# Patient Record
Sex: Male | Born: 1950 | Race: White | Hispanic: No | Marital: Married | Smoking: Never smoker
Health system: Southern US, Community
[De-identification: ages and names within clinical notes are randomized; demographics above are authoritative.]

## PROBLEM LIST (undated history)

## (undated) DIAGNOSIS — Z87442 Personal history of urinary calculi: Secondary | ICD-10-CM

## (undated) DIAGNOSIS — E7800 Pure hypercholesterolemia, unspecified: Secondary | ICD-10-CM

## (undated) DIAGNOSIS — I10 Essential (primary) hypertension: Secondary | ICD-10-CM

## (undated) DIAGNOSIS — J45909 Unspecified asthma, uncomplicated: Secondary | ICD-10-CM

## (undated) DIAGNOSIS — H539 Unspecified visual disturbance: Secondary | ICD-10-CM

## (undated) DIAGNOSIS — G43909 Migraine, unspecified, not intractable, without status migrainosus: Secondary | ICD-10-CM

## (undated) DIAGNOSIS — Z85828 Personal history of other malignant neoplasm of skin: Secondary | ICD-10-CM

## (undated) DIAGNOSIS — C801 Malignant (primary) neoplasm, unspecified: Secondary | ICD-10-CM

## (undated) DIAGNOSIS — N200 Calculus of kidney: Secondary | ICD-10-CM

## (undated) DIAGNOSIS — K219 Gastro-esophageal reflux disease without esophagitis: Secondary | ICD-10-CM

## (undated) HISTORY — PX: CYSTOSCOPY: SHX5120

## (undated) HISTORY — PX: ELBOW FRACTURE SURGERY: SHX616

## (undated) HISTORY — DX: Personal history of other malignant neoplasm of skin: Z85.828

## (undated) HISTORY — DX: Malignant (primary) neoplasm, unspecified: C80.1

## (undated) HISTORY — PX: CHOLECYSTECTOMY: SHX55

## (undated) HISTORY — DX: Migraine, unspecified, not intractable, without status migrainosus: G43.909

## (undated) HISTORY — DX: Calculus of kidney: N20.0

## (undated) HISTORY — PX: LITHOTRIPSY: SUR834

## (undated) HISTORY — DX: Unspecified visual disturbance: H53.9

---

## 2014-05-20 DIAGNOSIS — I10 Essential (primary) hypertension: Secondary | ICD-10-CM | POA: Insufficient documentation

## 2015-12-16 DIAGNOSIS — G43909 Migraine, unspecified, not intractable, without status migrainosus: Secondary | ICD-10-CM | POA: Insufficient documentation

## 2015-12-16 DIAGNOSIS — K219 Gastro-esophageal reflux disease without esophagitis: Secondary | ICD-10-CM | POA: Insufficient documentation

## 2015-12-16 DIAGNOSIS — E785 Hyperlipidemia, unspecified: Secondary | ICD-10-CM | POA: Insufficient documentation

## 2015-12-16 DIAGNOSIS — K589 Irritable bowel syndrome without diarrhea: Secondary | ICD-10-CM | POA: Insufficient documentation

## 2015-12-17 DIAGNOSIS — J309 Allergic rhinitis, unspecified: Secondary | ICD-10-CM | POA: Insufficient documentation

## 2015-12-17 DIAGNOSIS — N200 Calculus of kidney: Secondary | ICD-10-CM | POA: Insufficient documentation

## 2015-12-17 DIAGNOSIS — Z860100 Personal history of colon polyps, unspecified: Secondary | ICD-10-CM | POA: Insufficient documentation

## 2015-12-17 DIAGNOSIS — J4520 Mild intermittent asthma, uncomplicated: Secondary | ICD-10-CM | POA: Insufficient documentation

## 2015-12-20 DIAGNOSIS — J984 Other disorders of lung: Secondary | ICD-10-CM | POA: Insufficient documentation

## 2016-01-12 DIAGNOSIS — K8020 Calculus of gallbladder without cholecystitis without obstruction: Secondary | ICD-10-CM | POA: Insufficient documentation

## 2016-06-15 ENCOUNTER — Ambulatory Visit (INDEPENDENT_AMBULATORY_CARE_PROVIDER_SITE_OTHER): Payer: Commercial Managed Care - PPO | Admitting: Neurology

## 2016-06-15 ENCOUNTER — Encounter: Payer: Self-pay | Admitting: Neurology

## 2016-06-15 DIAGNOSIS — IMO0002 Reserved for concepts with insufficient information to code with codable children: Secondary | ICD-10-CM | POA: Insufficient documentation

## 2016-06-15 DIAGNOSIS — G43709 Chronic migraine without aura, not intractable, without status migrainosus: Secondary | ICD-10-CM

## 2016-06-15 DIAGNOSIS — N400 Enlarged prostate without lower urinary tract symptoms: Secondary | ICD-10-CM | POA: Diagnosis not present

## 2016-06-15 DIAGNOSIS — Z87442 Personal history of urinary calculi: Secondary | ICD-10-CM

## 2016-06-15 MED ORDER — NORTRIPTYLINE HCL 25 MG PO CAPS: 25.0000 mg | ORAL_CAPSULE | Freq: Every day | ORAL | 3 refills | Status: DC

## 2016-06-15 NOTE — Progress Notes (Signed)
[facility]  PATIENT: Nathaniel Norris Md Strauch DOB: 11/12/1951  REFERRING DOCTOR OR PCP:  None SOURCE: Patient, notes from [facility]  _________________________________   HISTORICAL  CHIEF COMPLAINT:  Chief Complaint  Patient presents with  . Migraines    Former pt. of Nathaniel Madden from [facility], Here for f/u of migraines.  Sts. he decreased Keppra to 500mg  daily due to low platelets.  H/A's have been more frequent, more severe--almost daily he wakes b/t 4am-6am with h/a.  He has been eval for allergies, sts. was told h/a's are not related to allergies/fim    HISTORY OF PRESENT ILLNESS:  Nathaniel Madden is a 65 yo man with migraine headaches who I have seen in the past at [facility], greater than 3 years ago.    Nathaniel Madden began to experience headaches as a teenager but they were more intermittent when he was younger. The headaches became more chronic about 15 years ago. At time, they have mostly occurred on a near daily basis. In the past, he has been on multiple medications as migraine prophylaxis. Zonisamide was tried but he developed kidney stones and had to be discontinued. Due to the kidney stone risk, Topamax cannot be used. Keppra was initiated and helped his headaches initially ever, due to low platelets the Keppra needed to be cut back. At the current dose of 500 mg daily, it is not helping the headaches and he also feels a little bit of a mental fog. About 4 years ago, he was on Botox and got up to 6 months benefit to a frequency  less than one migraine a week for a while.     Currently, he is experiencing headaches 6 out of every 7 days (25/30 days per month) the headaches occur for more than 4 hours each day.  In general, the headaches are left-sided with a pounding character. The headaches are usually associated with nausea, especially at the onset. Additionally he has photophobia and phonophobia. Moving the head will make the  headaches feel worse. He takes Maxalt when necessary. The Maxalt will often help to eliminate the headache.  Chronic migraine medications tried and failed: Antiepileptics: Zonisamide/Topamax was associated with kidney stones. Keppra has not been of benefit and causes him to feel foggy. Tricyclics: He has benign prostatic hypertrophy. Beta blockers were noneffective in the past Muscle relaxants were noneffective in the past NSAIDs (multiple) have not been effective enough. Additionally, he has GERD    REVIEW OF SYSTEMS: Constitutional: No fevers, chills, sweats, or change in appetite Eyes: No visual changes, double vision, eye pain Ear, nose and throat: No hearing loss, ear pain, nasal congestion, sore throat Cardiovascular: No chest pain, palpitations Respiratory: No shortness of breath at rest or with exertion.   No wheezes GastrointestinaI: No nausea, vomiting, diarrhea, abdominal pain, fecal incontinence Genitourinary: He has BPH. He has a history kidney stones.. Musculoskeletal: No neck pain, back pain Integumentary: No rash, pruritus, skin lesions Neurological: as above Psychiatric: No depression at this time.  No anxiety Endocrine: No palpitations, diaphoresis, change in appetite, change in weigh or increased thirst Hematologic/Lymphatic: No anemia, purpura, petechiae. Allergic/Immunologic: No itchy/runny eyes, nasal congestion, recent allergic reactions, rashes  ALLERGIES: Allergies  Allergen Reactions  . Levofloxacin     angioedema    HOME MEDICATIONS:  Current Outpatient Prescriptions:  .  albuterol (PROVENTIL HFA;VENTOLIN HFA) 108 (90 Base) MCG/ACT inhaler, Inhale into the lungs., Disp: , Rfl:  .  esomeprazole (NEXIUM) 40 MG capsule, Take  40 mg by mouth., Disp: , Rfl:  .  etodolac (LODINE) 400 MG tablet, Take 400 mg by mouth., Disp: , Rfl:  .  levETIRAcetam (KEPPRA) 500 MG tablet, Take 500 mg by mouth., Disp: , Rfl:  .  loratadine-pseudoephedrine (CLARITIN-D  24-HOUR) 10-240 MG 24 hr tablet, Take by mouth., Disp: , Rfl:  .  mometasone-formoterol (DULERA) 200-5 MCG/ACT AERO, Inhale into the lungs., Disp: , Rfl:  .  nebivolol (BYSTOLIC) 5 MG tablet, Take 5 mg by mouth., Disp: , Rfl:  .  nortriptyline (PAMELOR) 25 MG capsule, Take 1 capsule (25 mg total) by mouth at bedtime., Disp: 90 capsule, Rfl: 3  PAST MEDICAL HISTORY: Past Medical History:  Diagnosis Date  . Cancer ([place])   . Hx of skin cancer, basal cell   . Kidney stones   . Migraines   . Vision abnormalities     PAST SURGICAL HISTORY: Past Surgical History:  Procedure Laterality Date  . CHOLECYSTECTOMY    . ELBOW FRACTURE SURGERY Bilateral     FAMILY HISTORY: Family History  Problem Relation Age of Onset  . Diabetes Mother   . Hypertension Mother   . Pneumonia Father   . Hypertension Father   . Diabetes Father   . Dementia Father     SOCIAL HISTORY:  Social History   Social History  . Marital status: Married    Spouse name: N/A  . Number of children: N/A  . Years of education: N/A   Occupational History  . Not on file.   Social History Main Topics  . Smoking status: Never Smoker  . Smokeless tobacco: Not on file  . Alcohol use Not on file  . Drug use: Unknown  . Sexual activity: Not on file   Other Topics Concern  . Not on file   Social History Narrative  . No narrative on file     PHYSICAL EXAM  Vitals:   06/15/16 1320  BP: 130/80    There is no height or weight on file to calculate BMI.   General: The patient is well-developed and well-nourished and in no acute distress  Eyes:  Funduscopic exam shows normal optic discs and retinal vessels.  Neck: The neck is supple, no carotid bruits are noted.  The neck is nontender.  Cardiovascular: The heart has a regular rate and rhythm with a normal S1 and S2. There were no murmurs, gallops or rubs.   Skin: Extremities are without rash or edema.  Musculoskeletal:  Back is nontender  Neurologic  Exam  Mental status: The patient is alert and oriented x 3 at the time of the examination. The patient has apparent normal recent and remote memory, with an apparently normal attention span and concentration ability.   Speech is normal.  Cranial nerves: Extraocular movements are full. Pupils are equal, round, and reactive to light and accomodation.  Visual fields are full.  Facial symmetry is present. There is good facial sensation to soft touch bilaterally.Facial strength is normal.  Trapezius and sternocleidomastoid strength is normal. No dysarthria is noted.  The tongue is midline, and the patient has symmetric elevation of the soft palate. No obvious hearing deficits are noted.  Motor:  Muscle bulk is normal.   Tone is normal. Strength is  5 / 5 in all 4 extremities.   Sensory: Sensory testing is intact to temperature, soft touch and vibration sensation in all 4 extremities.  Coordination: Cerebellar testing reveals good finger-nose-finger bilaterally.  Gait and station: Station is normal.  Gait is normal. Tandem gait is normal. Romberg is negative.   Reflexes: Deep tendon reflexes are symmetric and normal bilaterally.   Plantar responses are flexor.    DIAGNOSTIC DATA (LABS, IMAGING, TESTING) - I reviewed patient records, labs, notes, testing and imaging myself where available.      ASSESSMENT AND PLAN  Chronic migraine  History of kidney stones  BPH (benign prostatic hyperplasia)    1.   In the past, Botox injections helped his chronic migraines the past. He has tried and failed multiple other medications as detailed above. 2.    He will stop Keppra. I'll have him try low-dose nortriptyline. Due to his benign prostatic hypertrophy, he will be unable to go on higher doses. Due to his history of kidney stones, he cannot go on topiramate or zonisamide.   3.    He will return to see me as needed and will return for the Botox injections.   Manraj Yeo A. Felecia Shelling, MD, PhD  [id number], [id number] PM Certified in Neurology, Clinical Neurophysiology, Sleep Medicine, Pain Medicine and Neuroimaging  [facility] [address] [place], [place] [address] [phone]

## 2016-08-02 ENCOUNTER — Telehealth: Payer: Self-pay | Admitting: Neurology

## 2016-08-02 MED ORDER — RIZATRIPTAN BENZOATE 10 MG PO TABS
10.0000 mg | ORAL_TABLET | ORAL | 0 refills | Status: DC | PRN
Start: 2016-08-02 — End: 2016-08-30

## 2016-08-02 NOTE — Telephone Encounter (Signed)
I will check with Nathaniel Madden to see where we are with the Botox approval./fim

## 2016-08-02 NOTE — Telephone Encounter (Signed)
Pt called and needs refill on Rizatriptan- He was prescribed medication while seeing Dr. Felecia Shelling at [facility]. Pt also wants to know about Botox, he has not heard anything . Please call [phone]

## 2016-08-02 NOTE — Telephone Encounter (Signed)
Noted/fim 

## 2016-08-02 NOTE — Telephone Encounter (Signed)
Called and scheduled patient for injections.

## 2016-08-30 ENCOUNTER — Encounter: Payer: Self-pay | Admitting: Neurology

## 2016-08-30 ENCOUNTER — Ambulatory Visit (INDEPENDENT_AMBULATORY_CARE_PROVIDER_SITE_OTHER): Payer: Commercial Managed Care - PPO | Admitting: Neurology

## 2016-08-30 VITALS — BP 146/84 | HR 70 | Resp 16 | Wt 197.0 lb

## 2016-08-30 DIAGNOSIS — IMO0002 Reserved for concepts with insufficient information to code with codable children: Secondary | ICD-10-CM

## 2016-08-30 DIAGNOSIS — G43709 Chronic migraine without aura, not intractable, without status migrainosus: Secondary | ICD-10-CM | POA: Diagnosis not present

## 2016-08-30 MED ORDER — NORTRIPTYLINE HCL 25 MG PO CAPS: 25.0000 mg | ORAL_CAPSULE | Freq: Every day | ORAL | 3 refills | Status: DC

## 2016-08-30 MED ORDER — RIZATRIPTAN BENZOATE 10 MG PO TABS: 10.0000 mg | ORAL_TABLET | ORAL | 3 refills | Status: DC | PRN

## 2016-08-30 NOTE — Progress Notes (Signed)
[facility]  PATIENT: Nathaniel Madden DOB: 31-May-1951  REFERRING DOCTOR OR PCP:  None SOURCE: Patient, notes from [facility]  _________________________________   HISTORICAL  CHIEF COMPLAINT:  Chief Complaint  Patient presents with  . 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  Since the last visit, nortriptyline was added. He tolerates it well and it is helping a little bit. However, he continues to have near daily headaches.  Currently, he is experiencing headaches 5-6 out of every 7 days (21-25/30 days per month).  The headaches occur for more than 4 hours each day.  In general, the headaches are left-sided with a pounding character. The headaches are usually associated with nausea, especially at the onset. Additionally he has photophobia and phonophobia. Moving the head will make the headaches feel worse. He takes Maxalt when necessary. The Maxalt will often help to eliminate the headache.  Chronic migraine medications tried and failed: Antiepileptics: Zonisamide/Topamax was associated with kidney stones. Keppra has not been of benefit and causes him to feel foggy. Tricyclics: He has benign prostatic hypertrophy. Beta blockers were noneffective in the past Muscle relaxants were noneffective in the past NSAIDs (multiple) have not been effective enough. 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 The headaches became more chronic about 15 years ago. At time, they have mostly occurred on a near daily basis. In the past, he has been on multiple medications as migraine prophylaxis. Zonisamide was tried but he developed kidney stones and had to be discontinued. Due to the kidney stone risk, Topamax cannot be used. Keppra was initiated and helped his headaches initially  ever, due to low platelets the Keppra needed to be cut back. At the current dose of 500 mg daily, it is not helping the headaches and he also feels a little bit of a mental fog. About 4 years ago, he was on Botox and got up to 6 months benefit to a frequency  less than one migraine a week for a while.      REVIEW OF SYSTEMS: Constitutional: No fevers, chills, sweats, or change in appetite Eyes: No visual changes, double vision, eye pain Ear, nose and throat: No hearing loss, ear pain, nasal congestion, sore throat Cardiovascular: No chest pain, palpitations Respiratory: No shortness of breath at rest or with exertion.   No wheezes GastrointestinaI: No nausea, vomiting, diarrhea, abdominal pain, fecal incontinence Genitourinary: He has BPH. He has a history kidney stones.. Musculoskeletal: No neck pain, back pain Integumentary: No rash, pruritus, skin lesions Neurological: as above Psychiatric: No depression at this time.  No anxiety Endocrine: No palpitations, diaphoresis, change in appetite, change in weigh or increased thirst Hematologic/Lymphatic: No anemia, purpura, petechiae. Allergic/Immunologic: No itchy/runny eyes, nasal congestion, recent allergic reactions, rashes  ALLERGIES: Allergies  Allergen Reactions  . Levofloxacin     angioedema    HOME MEDICATIONS:  Current Outpatient Prescriptions:  .  albuterol (PROVENTIL HFA;VENTOLIN HFA) 108 (90 Base) MCG/ACT inhaler, Inhale into the lungs., Disp: , Rfl:  .  esomeprazole (NEXIUM) 40 MG capsule, Take 40 mg by mouth., Disp: , Rfl:  .  etodolac (LODINE) 400 MG tablet, Take 400 mg by mouth., Disp: , Rfl:  .  levETIRAcetam (KEPPRA) 500 MG tablet, Take 500 mg by mouth., Disp: , Rfl:  .  mometasone-formoterol (DULERA) 200-5 MCG/ACT AERO, Inhale into the lungs., Disp: , Rfl:  .  nebivolol (BYSTOLIC) 5 MG tablet, Take 5 mg by mouth., Disp: , Rfl:  .  nortriptyline (PAMELOR) 25 MG capsule, Take 1 capsule (25 mg total) by mouth at  bedtime., Disp: 90 capsule, Rfl: 3 .  rizatriptan (MAXALT) 10 MG tablet, Take 1 tablet (10 mg total) by mouth as needed for migraine. May repeat in 2 hours if needed, Disp: 30 tablet, Rfl: 3  PAST MEDICAL HISTORY: Past Medical History:  Diagnosis Date  . Cancer ([place])   . Hx of skin cancer, basal cell   . Kidney stones   . Migraines   . Vision abnormalities     PAST SURGICAL HISTORY: Past Surgical History:  Procedure Laterality Date  . CHOLECYSTECTOMY    . ELBOW FRACTURE SURGERY Bilateral     FAMILY HISTORY: Family History  Problem Relation Age of Onset  . Diabetes Mother   . Hypertension Mother   . Pneumonia Father   . Hypertension Father   . Diabetes Father   . Dementia Father     SOCIAL HISTORY:  Social History   Social History  . Marital status: Married    Spouse name: N/A  . Number of children: N/A  . Years of education: N/A   Occupational History  . Not on file.   Social History Main Topics  . Smoking status: Never Smoker  . Smokeless tobacco: Not on file  . Alcohol use Not on file  . Drug use: Unknown  . Sexual activity: Not on file   Other Topics Concern  . Not on file   Social History Narrative  . No narrative on file     PHYSICAL EXAM  Vitals:   08/30/16 1618  BP: (!) 146/84  Pulse: 70  Resp: 16  Weight: 197 lb (89.4 kg)    There is no height or weight on file to calculate BMI.   General: The patient is well-developed and well-nourished and in no acute distress   Neck: The neck has good ROM.  The neck is nontender.   Neurologic Exam  Mental status: The patient is alert and oriented x 3 at the time of the examination. The patient has apparent normal recent and remote memory, with an apparently normal attention span and concentration ability.   Speech is normal.  Cranial nerves: Extraocular movements are full.  Facial strength is normal.  Trapezius and sternocleidomastoid strength is normal. No dysarthria is noted.    No obvious  hearing deficits are noted.  Motor:  Muscle bulk is normal.   Tone is normal. Strength is  5 / 5 in all 4 extremities.   Gait and station: Station is normal.   Gait is normal. Tandem gait is normal.     PROCEDURE Botox injections for chronic migraine as follows: 115 U Botox was injected into the muscles of the bilateral face and scalp (frontalis x 6 x 5U, temporalis x 8 x 5 U, occipitalis x 6 x 5 U, nasalis x 5U, corrugators x 2 x 5U) 70 U Botox was injected into the muscles of the neck and shoulders (splenius capitis 2 x 15 U, trapezius, 2 x 10 U, C6C7 paraspinal muscles 2 x 10 U.    15 U wasted  He tolerated the injections well.    ASSESSMENT AND PLAN  Chronic migraine   1.   Botox injections as detailed above for chronic migraines.  He tolerated the injections well.  2.    He will continue nortriptyline..   3.    He will return to see me in 3 months for the next Botox or as needed and will return for the Botox injections.   Markia Kyer A. Felecia Shelling, MD, PhD [id number], [id number] PM Certified in Neurology, Clinical Neurophysiology, Sleep Medicine, Pain Medicine and Neuroimaging  [facility] [address], [place], [place] [address] [phone]

## 2016-08-31 ENCOUNTER — Encounter: Payer: Self-pay | Admitting: *Deleted

## 2016-12-06 ENCOUNTER — Ambulatory Visit: Payer: Commercial Managed Care - PPO | Admitting: Neurology

## 2017-01-17 ENCOUNTER — Ambulatory Visit (INDEPENDENT_AMBULATORY_CARE_PROVIDER_SITE_OTHER): Payer: Commercial Managed Care - PPO | Admitting: Neurology

## 2017-01-17 ENCOUNTER — Encounter: Payer: Self-pay | Admitting: Neurology

## 2017-01-17 VITALS — BP 137/79 | HR 79 | Resp 16 | Wt 200.5 lb

## 2017-01-17 DIAGNOSIS — G43709 Chronic migraine without aura, not intractable, without status migrainosus: Secondary | ICD-10-CM

## 2017-01-17 DIAGNOSIS — R413 Other amnesia: Secondary | ICD-10-CM

## 2017-01-17 DIAGNOSIS — IMO0002 Reserved for concepts with insufficient information to code with codable children: Secondary | ICD-10-CM

## 2017-01-17 NOTE — Progress Notes (Signed)
[facility]  PATIENT: Nathaniel Madden DOB: 1950/12/27  REFERRING DOCTOR OR PCP:  None SOURCE: Patient, notes from [facility]  _________________________________   HISTORICAL  CHIEF COMPLAINT:  Chief Complaint  Patient presents with  . Memory Loss    Sts. he is having more difficutly remembering things, multitasking at work.  Believes this may be due to decreased focus/concentration, but since his father had Alzheimer's Dz. 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 Focus and attention are reduced.  He notes difficulty keeping track of multiple problems a patient reports and is mkeeping   His father had Alzheimer's disease.   Today, we did the MoCA test and he scored 27/30 (-2 for recall and -1 for language).   He is working full time in [employer] and plans to work until age 68.   He notes no change in gait or bladder.  He denies depression but maybe a little irritable at times.  He denies anxiety.   He sleeps well and he does not snore loudly and has never been noted to have OSA symptoms.    He sleeps better on nortriptyline.   Lab work was reviewed. The TSH and calcium were normal when last tested.    Headache:   Since the last visit, nortriptyline was added and he is doing better.  He tolerates it well and it is helping a little bit. However, he continues to have near daily headaches.   With the last Botox injections, he thought he got a benefit but he noticed some weakness with neck extension and had mild eyelid drooping..     In general, the headaches are left-sided with a pounding character. The headaches are usually associated with nausea, especially at the onset. Additionally he has photophobia and phonophobia. Moving the head will make the headaches feel worse. He takes Maxalt when  necessary. Maxalt will often help to eliminate the headache.  Chronic migraine medications tried and failed: Antiepileptics: Zonisamide/Topamax was associated with kidney stones. Keppra has not been of benefit and causes him to feel foggy. Tricyclics: He has benign prostatic hypertrophy. Beta blockers were noneffective in the past Muscle relaxants were noneffective in the past NSAIDs (multiple) have not been effective enough. 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 The headaches became more chronic about 15 years ago. At time, they have mostly occurred on a near daily basis. In the past, he has been on multiple medications as migraine prophylaxis. Zonisamide was tried but he developed kidney stones and had to be discontinued. Due to the kidney stone risk, Topamax cannot be used. Keppra was initiated and helped his headaches initially ever, due to low platelets the Keppra needed to be cut back. At the current dose of 500 mg daily, it is not helping the headaches and he also feels a little bit of a mental fog. About 4 years ago, he was on Botox and got up to 6 months benefit to a frequency  less than one migraine a week for a while.      REVIEW OF SYSTEMS: Constitutional: No fevers, chills, sweats, or change in appetite.   He sleeps well Eyes: No visual changes, double vision, eye pain Ear, nose and throat:  No hearing loss, ear pain, nasal congestion, sore throat Cardiovascular: No chest pain, palpitations Respiratory: No shortness of breath at rest or with exertion.   No wheezes GastrointestinaI: No nausea, vomiting, diarrhea, abdominal pain, fecal incontinence Genitourinary: He has BPH. He has a history kidney stones.. Musculoskeletal: No neck pain, back pain Integumentary: No rash, pruritus, skin lesions Neurological: as above Psychiatric: No depression at this time.  No anxiety Endocrine: No  palpitations, diaphoresis, change in appetite, change in weigh or increased thirst Hematologic/Lymphatic: No anemia, purpura, petechiae. Allergic/Immunologic: No itchy/runny eyes, nasal congestion, recent allergic reactions, rashes  ALLERGIES: Allergies  Allergen Reactions  . Levofloxacin     angioedema    HOME MEDICATIONS:  Current Outpatient Prescriptions:  .  albuterol (PROVENTIL HFA;VENTOLIN HFA) 108 (90 Base) MCG/ACT inhaler, Inhale into the lungs., Disp: , Rfl:  .  esomeprazole (NEXIUM) 40 MG capsule, Take 40 mg by mouth., Disp: , Rfl:  .  loratadine-pseudoephedrine (CLARITIN-D 24-HOUR) 10-240 MG 24 hr tablet, Take 1 tablet by mouth daily., Disp: , Rfl:  .  mometasone-formoterol (DULERA) 200-5 MCG/ACT AERO, Inhale into the lungs., Disp: , Rfl:  .  nebivolol (BYSTOLIC) 5 MG tablet, Take 5 mg by mouth., Disp: , Rfl:  .  nortriptyline (PAMELOR) 25 MG capsule, Take 1 capsule (25 mg total) by mouth at bedtime., Disp: 90 capsule, Rfl: 3 .  rizatriptan (MAXALT) 10 MG tablet, Take 1 tablet (10 mg total) by mouth as needed for migraine. May repeat in 2 hours if needed, Disp: 30 tablet, Rfl: 3 .  tamsulosin (FLOMAX) 0.4 MG CAPS capsule, Take 0.4 mg by mouth at bedtime., Disp: , Rfl:   PAST MEDICAL HISTORY: Past Medical History:  Diagnosis Date  . Cancer ([place])   . Hx of skin cancer, basal cell   . Kidney stones   . Migraines   . Vision abnormalities     PAST SURGICAL HISTORY: Past Surgical History:  Procedure Laterality Date  . CHOLECYSTECTOMY    . ELBOW FRACTURE SURGERY Bilateral     FAMILY HISTORY: Family History  Problem Relation Age of Onset  . Diabetes Mother   . Hypertension Mother   . Pneumonia Father   . Hypertension Father   . Diabetes Father   . Dementia Father     SOCIAL HISTORY:  Social History   Social History  . Marital status: Married    Spouse name: N/A  . Number of children: N/A  . Years of education: N/A   Occupational History  . Not on  file.   Social History Main Topics  . Smoking status: Never Smoker  . Smokeless tobacco: Never Used  . Alcohol use Not on file  . Drug use: Unknown  . Sexual activity: Not on file   Other Topics Concern  . Not on file   Social History Narrative  . No narrative on file     PHYSICAL EXAM  Vitals:   01/17/17 1330  BP: 137/79  Pulse: 79  Resp: 16  Weight: 200 lb 8 oz (90.9 kg)    There is no height or weight on file to calculate BMI.   General: The patient is well-developed and well-nourished and in no acute distress   Neck: The neck has good ROM.  The neck is nontender.   Neurologic Exam  Mental status: The patient is alert and oriented x 3 at the time of the examination. The patient has apparent normal recent and remote memory, with an apparently normal attention span and concentration ability.  Speech is normal.  Cranial nerves: Extraocular movements are full.  Facial strength is normal.  Trapezius and sternocleidomastoid strength is normal. No dysarthria is noted.    No obvious hearing deficits are noted.  Motor:  Muscle bulk is normal.   Tone is normal. Strength is  5 / 5 in all 4 extremities.   Sensory:   Vibration sensation is reduced in the toes to 25% but normal at ankles.     Gait and station: Station is normal.   Gait is normal. Tandem gait is normal.     PROCEDURE    ASSESSMENT AND PLAN  Chronic migraine - Plan: MR BRAIN WO CONTRAST  Memory change - Plan: Vitamin B12, VITAMIN D 25 Hydroxy (Vit-D Deficiency, Fractures), MR BRAIN WO CONTRAST   1.     The primary issues are more likely to be due to decreased focus and attention and to a primary memory disorder.  We will check an MRI of the brain without contrast to determine if there is an ischemic or inflammatory etiology rule and to rule out subdural hematomas and hydrocephalus 2.    He will continue nortriptyline for chronic migraine and consider Botox if headaches worsen again..   3.    He will  return to see me in 6 months or as needed   Cesareo Vickrey A. Felecia Shelling, MD, PhD [id number], 123456 PM Certified in Neurology, Clinical Neurophysiology, Sleep Medicine, Pain Medicine and Neuroimaging  [facility] [address] [place], [place] [address] [phone]

## 2017-01-18 LAB — VITAMIN D 25 HYDROXY (VIT D DEFICIENCY, FRACTURES): Vit D, 25-Hydroxy: 17.2 ng/mL — ABNORMAL LOW (ref 30.0–100.0)

## 2017-01-18 LAB — VITAMIN B12: VITAMIN B 12: 630 pg/mL (ref 232–1245)

## 2017-01-19 ENCOUNTER — Telehealth: Payer: Self-pay | Admitting: *Deleted

## 2017-01-19 MED ORDER — VITAMIN D (ERGOCALCIFEROL) 1.25 MG (50000 UNIT) PO CAPS: 50000.0000 [IU] | ORAL_CAPSULE | ORAL | 0 refills | Status: DC

## 2017-01-19 NOTE — Telephone Encounter (Signed)
I have spoken with [name] this am and per RAS, reviewed lab results as below, and rec. tx. plan.  He is agreable.  Rx. escribed to [facility]/fim

## 2017-01-19 NOTE — Telephone Encounter (Signed)
-----   Message from Britt Bottom, MD sent at 01/18/2017  6:51 PM EST ----- Dr. Jerrye Bushy vitamin D was moderately low at 17.2. Please call in 50,000 units weekly 12 weeks and then he should take 2000 units daily afterwards.   B12 was fine.

## 2017-01-20 ENCOUNTER — Ambulatory Visit
Admission: RE | Admit: 2017-01-20 | Discharge: 2017-01-20 | Disposition: A | Discharge status: Home / Self Care | Payer: Commercial Managed Care - PPO | Source: Ambulatory Visit | Attending: Neurology | Admitting: Neurology

## 2017-01-20 DIAGNOSIS — G43709 Chronic migraine without aura, not intractable, without status migrainosus: Secondary | ICD-10-CM

## 2017-01-20 DIAGNOSIS — R413 Other amnesia: Secondary | ICD-10-CM

## 2017-01-20 DIAGNOSIS — IMO0002 Reserved for concepts with insufficient information to code with codable children: Secondary | ICD-10-CM

## 2017-01-22 ENCOUNTER — Telehealth: Payer: Self-pay | Admitting: Neurology

## 2017-01-22 NOTE — Telephone Encounter (Signed)
I discussed the results of the MRI with [name].

## 2017-07-25 ENCOUNTER — Ambulatory Visit: Payer: Commercial Managed Care - PPO | Admitting: Neurology

## 2017-09-12 ENCOUNTER — Ambulatory Visit (INDEPENDENT_AMBULATORY_CARE_PROVIDER_SITE_OTHER): Payer: PRIVATE HEALTH INSURANCE | Admitting: Neurology

## 2017-09-12 ENCOUNTER — Encounter: Payer: Self-pay | Admitting: Neurology

## 2017-09-12 VITALS — BP 134/80 | HR 78 | Resp 16 | Wt 202.0 lb

## 2017-09-12 DIAGNOSIS — G43009 Migraine without aura, not intractable, without status migrainosus: Secondary | ICD-10-CM | POA: Diagnosis not present

## 2017-09-12 DIAGNOSIS — R413 Other amnesia: Secondary | ICD-10-CM | POA: Diagnosis not present

## 2017-09-12 MED ORDER — RIZATRIPTAN BENZOATE 10 MG PO TABS: 10.0000 mg | ORAL_TABLET | ORAL | 3 refills | Status: DC | PRN

## 2017-09-12 MED ORDER — NORTRIPTYLINE HCL 25 MG PO CAPS: ORAL_CAPSULE | ORAL | 3 refills | Status: DC

## 2017-09-12 NOTE — Progress Notes (Signed)
[facility]  PATIENT: Nathaniel Madden DOB: 26-Dec-1950  REFERRING DOCTOR OR PCP:  None SOURCE: Patient, notes from [facility]  _________________________________   HISTORICAL  CHIEF COMPLAINT:  Chief Complaint  Patient presents with  . 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    Update 09/12/2017:     He is noting HA 10-15 days a month, usually over the left eye more than the right side.    He can't identify any definite triggers though some occur with changes in barometric pressure.    Sometimes he wakes up with one.    He takes Maxalt for most of them and Excedrin Migraine if milder.     He feels that the nortriptyline is helping the headache frequency some. It has helped better than many other medications which have been tried. He cannot go on Topamax or zonisamide due to kidney stones.  He sleeps well at night.     He feels that his memory is doing better now than earlier this year. He still occasionally notes some reduced recall but this is not getting in the way of his job.   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 Focus and attention are reduced.  He notes difficulty keeping track of multiple problems a patient reports and is mkeeping   His father had Alzheimer's disease.   Today, we did the MoCA test and he scored 27/30 (-2 for recall and -1 for language).   He is working full time in [employer] and plans to work until age 12.   He notes no change in gait or bladder.  He denies depression but maybe a little irritable at times.  He denies anxiety.   He sleeps well and he does not snore loudly and has never been noted to have  OSA symptoms.    He sleeps better on nortriptyline.   Lab work was reviewed. The TSH and calcium were normal when last tested.    Headache:   Since the last visit, nortriptyline was added and he is doing better.  He tolerates it well and it is helping a little bit. However, he continues to have near daily headaches.   With the last Botox injections, he thought he got a benefit but he noticed some weakness with neck extension and had mild eyelid drooping..     In general, the headaches are left-sided with a pounding character. The headaches are usually associated with nausea, especially at the onset. Additionally he has photophobia and phonophobia. Moving the head will make the headaches feel worse. He takes Maxalt when necessary. Maxalt will often help to eliminate the headache.  Chronic migraine medications tried and failed: Antiepileptics: Zonisamide/Topamax was associated with kidney stones. Keppra has not been of benefit and causes him to feel foggy. Tricyclics: He has benign prostatic hypertrophy. Beta blockers were noneffective in the past Muscle relaxants were noneffective in the past NSAIDs (multiple) have not been effective enough. 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 The headaches became more chronic about 15 years  ago. At time, they have mostly occurred on a near daily basis. In the past, he has been on multiple medications as migraine prophylaxis. Zonisamide was tried but he developed kidney stones and had to be discontinued. Due to the kidney stone risk, Topamax cannot be used. Keppra was initiated and helped his headaches initially ever, due to low platelets the Keppra needed to be cut back. At the current dose of 500 mg daily, it is not helping the headaches and he also feels a little bit of a mental fog. About 4 years ago, he was on Botox and got up to 6 months benefit to a frequency  less than  one migraine a week for a while.      REVIEW OF SYSTEMS: Constitutional: No fevers, chills, sweats, or change in appetite.   He sleeps well Eyes: No visual changes, double vision, eye pain Ear, nose and throat: No hearing loss, ear pain, nasal congestion, sore throat Cardiovascular: No chest pain, palpitations Respiratory: No shortness of breath at rest or with exertion.   No wheezes GastrointestinaI: No nausea, vomiting, diarrhea, abdominal pain, fecal incontinence Genitourinary: He has BPH. He has a history kidney stones.. Musculoskeletal: No neck pain, back pain Integumentary: No rash, pruritus, skin lesions Neurological: as above Psychiatric: No depression at this time.  No anxiety Endocrine: No palpitations, diaphoresis, change in appetite, change in weigh or increased thirst Hematologic/Lymphatic: No anemia, purpura, petechiae. Allergic/Immunologic: No itchy/runny eyes, nasal congestion, recent allergic reactions, rashes  ALLERGIES: Allergies  Allergen Reactions  . Levofloxacin     angioedema    HOME MEDICATIONS:  Current Outpatient Prescriptions:  .  albuterol (PROVENTIL HFA;VENTOLIN HFA) 108 (90 Base) MCG/ACT inhaler, Inhale into the lungs., Disp: , Rfl:  .  loratadine-pseudoephedrine (CLARITIN-D 24-HOUR) 10-240 MG 24 hr tablet, Take 1 tablet by mouth daily., Disp: , Rfl:  .  mometasone-formoterol (DULERA) 200-5 MCG/ACT AERO, Inhale into the lungs., Disp: , Rfl:  .  nebivolol (BYSTOLIC) 5 MG tablet, Take 5 mg by mouth., Disp: , Rfl:  .  nortriptyline (PAMELOR) 25 MG capsule, Take one or two qHS, Disp: 180 capsule, Rfl: 3 .  rizatriptan (MAXALT) 10 MG tablet, Take 1 tablet (10 mg total) by mouth as needed for migraine. May repeat in 2 hours if needed, Disp: 30 tablet, Rfl: 3 .  tamsulosin (FLOMAX) 0.4 MG CAPS capsule, Take 0.4 mg by mouth at bedtime., Disp: , Rfl:  .  esomeprazole (NEXIUM) 40 MG capsule, Take 40 mg by mouth., Disp: , Rfl:   PAST MEDICAL HISTORY: Past  Medical History:  Diagnosis Date  . Cancer ([place])   . Hx of skin cancer, basal cell   . Kidney stones   . Migraines   . Vision abnormalities     PAST SURGICAL HISTORY: Past Surgical History:  Procedure Laterality Date  . CHOLECYSTECTOMY    . ELBOW FRACTURE SURGERY Bilateral     FAMILY HISTORY: Family History  Problem Relation Age of Onset  . Diabetes Mother   . Hypertension Mother   . Pneumonia Father   . Hypertension Father   . Diabetes Father   . Dementia Father     SOCIAL HISTORY:  Social History   Social History  . Marital status: Married    Spouse name: N/A  . Number of children: N/A  . Years of education: N/A   Occupational History  . Not on file.   Social History Main Topics  . Smoking status: Never Smoker  . Smokeless tobacco: Never Used  .  Alcohol use Not on file  . Drug use: Unknown  . Sexual activity: Not on file   Other Topics Concern  . Not on file   Social History Narrative  . No narrative on file     PHYSICAL EXAM  Vitals:   09/12/17 1511  BP: 134/80  Pulse: 78  Resp: 16  Weight: 202 lb (91.6 kg)    There is no height or weight on file to calculate BMI.   General: The patient is well-developed and well-nourished and in no acute distress   Neck: The neck has good ROM.  The neck is nontender.   Neurologic Exam  Mental status: The patient is alert and oriented x 3 at the time of the examination. The patient has apparent normal recent and remote memory, with an apparently normal attention span and concentration ability.   Speech is normal.  Cranial nerves: Extraocular movements are full.  Facial strength is normal.  Trapezius and sternocleidomastoid strength is normal. No dysarthria is noted.    No obvious hearing deficits are noted.  Motor:  Muscle bulk is normal.   Tone is normal. Strength is  5 / 5 in all 4 extremities.    Coordination:  Finger-nose-finger and heel-to-shin are normal.     Gait and station: Station is  normal.   Gait is normal. Tandem gait is normal.       ASSESSMENT AND PLAN  Common migraine without intractability  Memory change   1.     Renew triptan for migraines.  2.    He will continue nortriptyline for chronic migraine .  Increase to 50 mg nightly if tolerated. We also discussed a drug study we will be starting soon for episodic migraine.   3.    He will return to see me in 6-12 months or as needed    A. Felecia Shelling, MD, PhD 41/01/130, 4:38 PM Certified in Neurology, Clinical Neurophysiology, Sleep Medicine, Pain Medicine and Neuroimaging  [facility] [address] [place], [place] [address] [phone]

## 2018-09-12 ENCOUNTER — Encounter: Payer: Self-pay | Admitting: Neurology

## 2018-09-12 ENCOUNTER — Other Ambulatory Visit: Payer: Self-pay

## 2018-09-12 ENCOUNTER — Ambulatory Visit: Payer: Medicare HMO | Admitting: Neurology

## 2018-09-12 VITALS — BP 116/73 | HR 76 | Ht 72.0 in | Wt 198.5 lb

## 2018-09-12 DIAGNOSIS — R413 Other amnesia: Secondary | ICD-10-CM

## 2018-09-12 DIAGNOSIS — G43009 Migraine without aura, not intractable, without status migrainosus: Secondary | ICD-10-CM

## 2018-09-12 MED ORDER — NORTRIPTYLINE HCL 25 MG PO CAPS: ORAL_CAPSULE | ORAL | 3 refills | Status: DC

## 2018-09-12 MED ORDER — RIZATRIPTAN BENZOATE 10 MG PO TABS: 10.0000 mg | ORAL_TABLET | ORAL | 3 refills | Status: DC | PRN

## 2018-09-12 NOTE — Progress Notes (Signed)
[facility]  PATIENT: Nathaniel Norris Md Bazzi DOB: 01-Nov-1951  REFERRING DOCTOR OR PCP:  None SOURCE: Patient, notes from [facility]  _________________________________   HISTORICAL  CHIEF COMPLAINT:  Chief Complaint  Patient presents with  . Follow-up    RM 12, alone. Last seen 09/12/17. Here to f/u on migraines.   . Migraine    Takes nortriptyline 25-50mg  qhs. This elped at first but his PCP thought it was causing urinary problems and decreased his dose to 10mg . Lowering the dose mafe his headaches worse. He went back to 25-50mg  qhs which helped improve them. Feels sinus issues making headaches worse.  He is now taking flomax for urinary issues which has helped.     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   Update 09/12/2018: He feels migraines are better since he retired earlier this year.     He is having 5-10 migraines a month now that usually are present in the morning and associated with a congested feeling.   He denies N/V and no significant photophobia or phonophobia.     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         He sleeps well most nights.     Urinary hesitancy is doing well on Flomax.     He reports that memory has done better this year compared to last year.   Update 09/12/2017:     He is noting HA 10-15 days a month, usually over the left eye more than the right side.    He can't identify any definite triggers though some occur with changes in barometric pressure.    Sometimes he wakes up with one.    He takes Maxalt for most of them and Excedrin Migraine if milder.     He feels that the nortriptyline is helping the headache frequency some. It has helped better than many other medications which have been tried. He cannot go on Topamax or zonisamide due  to kidney stones.  He sleeps well at night.     He feels that his memory is doing better now than earlier this year. He still occasionally notes some reduced recall but this is not getting in the way of his job.   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 Focus and attention are reduced.  He notes difficulty keeping track of multiple problems a patient reports and is mkeeping   His father had Alzheimer's disease.   Today, we did the MoCA test and he scored 27/30 (-2 for recall and -1 for language).   He is working full time in [employer] and plans to work until age 31.   He notes no change in gait or bladder.  He denies depression but maybe a little irritable at times.  He denies anxiety.   He sleeps well and he does not snore loudly and has never been noted to have OSA symptoms.    He sleeps better on nortriptyline.   Lab work was reviewed. The TSH and calcium were normal when last tested.    Headache:   Since the last visit, nortriptyline was added and he is doing better.  He tolerates it well and it is helping a little bit. However, he continues to have near daily headaches.   With the last Botox injections, he thought he got a benefit but he noticed some weakness with neck extension and had mild eyelid drooping..     In general, the headaches are left-sided with a pounding character. The headaches are usually associated with nausea, especially at the onset. Additionally he has photophobia and phonophobia. Moving the head will make the headaches feel worse. He takes Maxalt when necessary. Maxalt will often help to eliminate the headache.  Chronic migraine medications tried and failed: Antiepileptics: Zonisamide/Topamax was associated with kidney stones. Keppra has not been of benefit and causes him to feel foggy. Tricyclics: He has benign prostatic hypertrophy. Beta blockers were  noneffective in the past Muscle relaxants were noneffective in the past NSAIDs (multiple) have not been effective enough. 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 The headaches became more chronic about 15 years ago. At time, they have mostly occurred on a near daily basis. In the past, he has been on multiple medications as migraine prophylaxis. Zonisamide was tried but he developed kidney stones and had to be discontinued. Due to the kidney stone risk, Topamax cannot be used. Keppra was initiated and helped his headaches initially ever, due to low platelets the Keppra needed to be cut back. At the current dose of 500 mg daily, it is not helping the headaches and he also feels a little bit of a mental fog. About 4 years ago, he was on Botox and got up to 6 months benefit to a frequency  less than one migraine a week for a while.      REVIEW OF SYSTEMS: Constitutional: No fevers, chills, sweats, or change in appetite.   He sleeps well Eyes: No visual changes, double vision, eye pain Ear, nose and throat: No hearing loss, ear pain, nasal congestion, sore throat Cardiovascular: No chest pain, palpitations Respiratory: No shortness of breath at rest or with exertion.   No wheezes GastrointestinaI: No nausea, vomiting, diarrhea, abdominal pain, fecal incontinence Genitourinary: He has BPH. He has a history kidney stones.. Musculoskeletal: No neck pain, back pain Integumentary: No rash, pruritus, skin lesions Neurological: as above Psychiatric: No depression at this time.  No anxiety Endocrine: No palpitations, diaphoresis, change in appetite, change in weigh or increased thirst Hematologic/Lymphatic: No anemia, purpura, petechiae. Allergic/Immunologic: No itchy/runny eyes, nasal congestion, recent allergic reactions, rashes  ALLERGIES: Allergies  Allergen Reactions  . Levofloxacin     angioedema     HOME MEDICATIONS:  Current Outpatient Medications:  .  albuterol (PROVENTIL HFA;VENTOLIN HFA) 108 (90 Base) MCG/ACT inhaler, Inhale into the lungs., Disp: , Rfl:  .  loratadine-pseudoephedrine (CLARITIN-D 24-HOUR) 10-240 MG 24 hr tablet, Take 1 tablet by mouth daily., Disp: , Rfl:  .  mometasone-formoterol (DULERA) 200-5 MCG/ACT AERO, Inhale into the lungs., Disp: , Rfl:  .  nebivolol (BYSTOLIC) 5 MG tablet, Take 5 mg by mouth., Disp: , Rfl:  .  nortriptyline (PAMELOR) 25 MG capsule, Take one or two qHS, Disp: 180 capsule, Rfl: 3 .  ranitidine (ZANTAC) 150 MG tablet, Take 150 mg by mouth at bedtime., Disp: , Rfl:  .  rizatriptan (MAXALT) 10 MG tablet, Take 1 tablet (10 mg total) by mouth as needed for migraine. May repeat in 2 hours if needed, Disp: 30 tablet, Rfl: 3 .  tamsulosin (  FLOMAX) 0.4 MG CAPS capsule, Take 0.4 mg by mouth at bedtime., Disp: , Rfl:  .  esomeprazole (NEXIUM) 40 MG capsule, Take 40 mg by mouth., Disp: , Rfl:   PAST MEDICAL HISTORY: Past Medical History:  Diagnosis Date  . Cancer ([place])   . Hx of skin cancer, basal cell   . Kidney stones   . Migraines   . Vision abnormalities     PAST SURGICAL HISTORY: Past Surgical History:  Procedure Laterality Date  . CHOLECYSTECTOMY    . ELBOW FRACTURE SURGERY Bilateral     FAMILY HISTORY: Family History  Problem Relation Age of Onset  . Diabetes Mother   . Hypertension Mother   . Pneumonia Father   . Hypertension Father   . Diabetes Father   . Dementia Father     SOCIAL HISTORY:  Social History   Socioeconomic History  . Marital status: Married    Spouse name: Not on file  . Number of children: Not on file  . Years of education: Not on file  . Highest education level: Not on file  Occupational History  . Not on file  Social Needs  . Financial resource strain: Not on file  . Food insecurity:    Worry: Not on file    Inability: Not on file  . Transportation needs:    Medical: Not on file     Non-medical: Not on file  Tobacco Use  . Smoking status: Never Smoker  . Smokeless tobacco: Never Used  Substance and Sexual Activity  . Alcohol use: Not on file  . Drug use: Not on file  . Sexual activity: Not on file  Lifestyle  . Physical activity:    Days per week: Not on file    Minutes per session: Not on file  . Stress: Not on file  Relationships  . Social connections:    Talks on phone: Not on file    Gets together: Not on file    Attends religious service: Not on file    Active member of club or organization: Not on file    Attends meetings of clubs or organizations: Not on file    Relationship status: Not on file  . Intimate partner violence:    Fear of current or ex partner: Not on file    Emotionally abused: Not on file    Physically abused: Not on file    Forced sexual activity: Not on file  Other Topics Concern  . Not on file  Social History Narrative  . Not on file     PHYSICAL EXAM  Vitals:   09/12/18 0949  BP: 116/73  Pulse: 76  Weight: 198 lb 8 oz (90 kg)  Height: 6' (1.829 m)    Body mass index is 26.92 kg/m.   General: The patient is well-developed and well-nourished and in no acute distress   Neck: The neck has good ROM.  The neck is nontender.  The occiput is nontender.  Neurologic Exam  Mental status: The patient is alert and oriented x 3 at the time of the examination. The patient has apparent normal recent and remote memory, with an apparently normal attention span and concentration ability.   Speech is normal.  Cranial nerves: Extraocular movements are full.  Facial strength is normal.  Trapezius strength is normal.    No obvious hearing deficits are noted.  Motor:  Muscle bulk is normal.   Tone is normal. Strength is  5 / 5 in all 4  extremities.    Coordination: Finger-nose-finger and heel-to-shin is performed well.  Gait and station: Station is normal.   Gait is normal. Tandem gait is normal.   Romberg is negative.       ASSESSMENT AND PLAN  Common migraine without intractability  Memory change   1.   Continue nortriptyline at bedtime for chronic migraine.    He is doing better on 50 mg nightly compared to 25 mg nightly. 2.    Maxalt as needed migraine. 3.    He will return to see me in 6-12 months or as needed   Richard A. Felecia Shelling, MD, PhD 29/24/4628, 6:38 PM Certified in Neurology, Clinical Neurophysiology, Sleep Medicine, Pain Medicine and Neuroimaging  [facility] [address] [place], [place] [address] [phone]

## 2018-09-19 ENCOUNTER — Telehealth: Payer: Self-pay | Admitting: *Deleted

## 2018-09-19 NOTE — Telephone Encounter (Signed)
Nellie to advise they should fill rizatripan #30/90days or #10/30 day supply. Spoke with pharmacist. They placed note on profile.

## 2018-12-19 DIAGNOSIS — N528 Other male erectile dysfunction: Secondary | ICD-10-CM | POA: Insufficient documentation

## 2018-12-19 DIAGNOSIS — Z789 Other specified health status: Secondary | ICD-10-CM | POA: Insufficient documentation

## 2018-12-19 DIAGNOSIS — I7300 Raynaud's syndrome without gangrene: Secondary | ICD-10-CM | POA: Insufficient documentation

## 2019-04-10 ENCOUNTER — Ambulatory Visit (HOSPITAL_COMMUNITY)
Admission: RE | Admit: 2019-04-10 | Discharge: 2019-04-10 | Disposition: A | Discharge status: Home / Self Care | Payer: Medicare HMO | Attending: Urology | Admitting: Urology

## 2019-04-10 ENCOUNTER — Encounter (HOSPITAL_COMMUNITY)
Admission: RE | Disposition: A | Discharge status: Home / Self Care | Payer: Self-pay | Source: Home / Self Care | Attending: Urology

## 2019-04-10 ENCOUNTER — Other Ambulatory Visit (HOSPITAL_COMMUNITY)
Admission: RE | Admit: 2019-04-10 | Discharge: 2019-04-10 | Disposition: A | Discharge status: Home / Self Care | Payer: Medicare HMO | Source: Ambulatory Visit | Attending: Urology | Admitting: Urology

## 2019-04-10 ENCOUNTER — Other Ambulatory Visit: Payer: Self-pay | Admitting: Urology

## 2019-04-10 ENCOUNTER — Ambulatory Visit (HOSPITAL_COMMUNITY): Payer: Medicare HMO

## 2019-04-10 ENCOUNTER — Encounter (HOSPITAL_COMMUNITY): Payer: Self-pay | Admitting: General Practice

## 2019-04-10 DIAGNOSIS — Z1159 Encounter for screening for other viral diseases: Secondary | ICD-10-CM | POA: Diagnosis not present

## 2019-04-10 DIAGNOSIS — N201 Calculus of ureter: Secondary | ICD-10-CM | POA: Insufficient documentation

## 2019-04-10 DIAGNOSIS — J45909 Unspecified asthma, uncomplicated: Secondary | ICD-10-CM | POA: Insufficient documentation

## 2019-04-10 DIAGNOSIS — E7800 Pure hypercholesterolemia, unspecified: Secondary | ICD-10-CM | POA: Insufficient documentation

## 2019-04-10 DIAGNOSIS — G43909 Migraine, unspecified, not intractable, without status migrainosus: Secondary | ICD-10-CM | POA: Insufficient documentation

## 2019-04-10 DIAGNOSIS — Z85828 Personal history of other malignant neoplasm of skin: Secondary | ICD-10-CM | POA: Insufficient documentation

## 2019-04-10 DIAGNOSIS — K219 Gastro-esophageal reflux disease without esophagitis: Secondary | ICD-10-CM | POA: Insufficient documentation

## 2019-04-10 DIAGNOSIS — I10 Essential (primary) hypertension: Secondary | ICD-10-CM | POA: Insufficient documentation

## 2019-04-10 DIAGNOSIS — Z79899 Other long term (current) drug therapy: Secondary | ICD-10-CM | POA: Diagnosis not present

## 2019-04-10 HISTORY — DX: Pure hypercholesterolemia, unspecified: E78.00

## 2019-04-10 HISTORY — DX: Unspecified asthma, uncomplicated: J45.909

## 2019-04-10 HISTORY — DX: Gastro-esophageal reflux disease without esophagitis: K21.9

## 2019-04-10 HISTORY — DX: Personal history of urinary calculi: Z87.442

## 2019-04-10 HISTORY — PX: EXTRACORPOREAL SHOCK WAVE LITHOTRIPSY: SHX1557

## 2019-04-10 HISTORY — DX: Essential (primary) hypertension: I10

## 2019-04-10 LAB — SARS CORONAVIRUS 2 BY RT PCR (HOSPITAL ORDER, PERFORMED IN ~~LOC~~ HOSPITAL LAB): SARS Coronavirus 2: NEGATIVE

## 2019-04-10 SURGERY — LITHOTRIPSY, ESWL
Anesthesia: Choice | Laterality: Right

## 2019-04-10 MED ORDER — DIPHENHYDRAMINE HCL 25 MG PO CAPS
25.0000 mg | ORAL_CAPSULE | ORAL | Status: AC
  Administered 2019-04-10: 25 mg via ORAL
  Filled 2019-04-10: qty 1

## 2019-04-10 MED ORDER — DIAZEPAM 5 MG PO TABS
10.0000 mg | ORAL_TABLET | ORAL | Status: AC
  Administered 2019-04-10: 10 mg via ORAL
  Filled 2019-04-10: qty 2

## 2019-04-10 MED ORDER — SODIUM CHLORIDE 0.9 % IV SOLN
INTRAVENOUS | Status: DC
  Administered 2019-04-10: 15:00:00 via INTRAVENOUS

## 2019-04-10 MED ORDER — GENTAMICIN SULFATE 40 MG/ML IJ SOLN
240.0000 mg | INTRAVENOUS | Status: AC
  Administered 2019-04-10: 15:00:00 240 mg via INTRAVENOUS
  Filled 2019-04-10: qty 6

## 2019-04-10 NOTE — Op Note (Signed)
See [facility] OP note scanned into chart. Also because of the size, density, location and other factors that cannot be anticipated I feel this will likely be a staged procedure. This fact supersedes any indication in the scanned [facility] operative note to the contrary.  

## 2019-04-10 NOTE — Discharge Instructions (Signed)
See [facility] discharge instructions in chart.  

## 2019-04-10 NOTE — H&P (Signed)
History of present illness: 13M with history of left mid-ureteral stone.  He is having pain that is severe and poorly controlled.  He is having associated nausea.  He denies any fevers/chills.  He does not have any dysuria or hematuria.  He has a history of stones and has been treated several times in [facility].    He underwent lithotripsy for his stone on Tuesday for a mid-left sided ureteral stone and his pain has not subsided.  Repeat imaging demonstrates that the stone has migrated a few centimeters further down the left ureter but has not been fragmented.  He is eager to proceed with retreatment.  Review of systems: A 12 point comprehensive review of systems was obtained and is negative unless otherwise stated in the history of present illness.  Patient Active Problem List   Diagnosis Date Noted  . 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 Memory change 01/17/2017  . Chronic migraine 06/15/2016  . History of kidney stones 06/15/2016  . BPH (benign prostatic hyperplasia) 06/15/2016    No current facility-administered medications on file prior to encounter.    Current Outpatient Medications on File Prior to Encounter  Medication Sig Dispense Refill  . amLODipine (NORVASC) 2.5 MG tablet Take 2.5 mg by mouth daily.    [name] esomeprazole (NEXIUM) 40 MG capsule Take 40 mg by mouth.    . loratadine-pseudoephedrine (CLARITIN-D 24-HOUR) 10-240 MG 24 hr tablet Take 1 tablet by mouth daily.    . nortriptyline (PAMELOR) 25 MG capsule Take one or two qHS 180 capsule 3  . rizatriptan (MAXALT) 10 MG tablet Take 1 tablet (10 mg total) by mouth as needed for migraine. May repeat in 2 hours if needed 30 tablet 3  . rosuvastatin (CRESTOR) 10 MG tablet Take 10 mg by mouth daily.    . tamsulosin (FLOMAX) 0.4 MG CAPS capsule Take 0.4 mg by mouth at bedtime.    [name] albuterol (PROVENTIL HFA;VENTOLIN HFA) 108 (90 Base) MCG/ACT inhaler Inhale into the lungs.    . mometasone-formoterol (DULERA) 200-5  MCG/ACT AERO Inhale into the lungs.    . nebivolol (BYSTOLIC) 5 MG tablet Take 5 mg by mouth.    . ranitidine (ZANTAC) 150 MG tablet Take 150 mg by mouth at bedtime.      Past Medical History:  Diagnosis Date  . Acid reflux   . Asthma   . Cancer ([place])   . High cholesterol   . History of kidney stones   . Hx of skin cancer, basal cell   . Hypertension   . Kidney stones   . Migraines   . Vision abnormalities     Past Surgical History:  Procedure Laterality Date  . CHOLECYSTECTOMY    . CYSTOSCOPY    . ELBOW FRACTURE SURGERY Bilateral   . LITHOTRIPSY     x4    Social History   Tobacco Use  . Smoking status: Never Smoker  . Smokeless tobacco: Never Used  Substance Use Topics  . Alcohol use: Not on file  . Drug use: Not on file    Family History  Problem Relation Age of Onset  . Diabetes Mother   . Hypertension Mother   . Pneumonia Father   . Hypertension Father   . Diabetes Father   . Dementia Father     PE: Vitals:   04/10/19 1436  BP: (!) 169/98  Resp: 18  Temp: 97.9 F (36.6 C)  TempSrc: Oral  SpO2: 96%  Weight: 93.7 kg  Height:  6' (1.829 m)   Patient appears to be in no acute distress  patient is alert and oriented x3 Atraumatic normocephalic head No cervical or supraclavicular lymphadenopathy appreciated No increased work of breathing, no audible wheezes/rhonchi Regular sinus rhythm/rate Abdomen is soft, nontender, nondistended, left sided CVA tenderness Lower extremities are symmetric without appreciable edema Grossly neurologically intact No identifiable skin lesions  No results for input(s): WBC, HGB, HCT in the last 72 hours. No results for input(s): NA, K, CL, CO2, GLUCOSE, BUN, CREATININE, CALCIUM in the last 72 hours. No results for input(s): LABPT, INR in the last 72 hours. No results for input(s): LABURIN in the last 72 hours. Results for orders placed or performed during the hospital encounter of 04/10/19  SARS Coronavirus 2  (CEPHEID - Performed in [facility] hospital lab), Hosp Order     Status: None   Collection Time: 04/10/19  1:44 PM  Result Value Ref Range Status   SARS Coronavirus 2 NEGATIVE NEGATIVE Final    Comment: (NOTE) If result is NEGATIVE SARS-CoV-2 target nucleic acids are NOT DETECTED. The SARS-CoV-2 RNA is generally detectable in upper and lower  respiratory specimens during the acute phase of infection. The lowest  concentration of SARS-CoV-2 viral copies this assay can detect is 250  copies / mL. A negative result does not preclude SARS-CoV-2 infection  and should not be used as the sole basis for treatment or other  patient management decisions.  A negative result may occur with  improper specimen collection / handling, submission of specimen other  than nasopharyngeal swab, presence of viral mutation(s) within the  areas targeted by this assay, and inadequate number of viral copies  (<250 copies / mL). A negative result must be combined with clinical  observations, patient history, and epidemiological information. If result is POSITIVE SARS-CoV-2 target nucleic acids are DETECTED. The SARS-CoV-2 RNA is generally detectable in upper and lower  respiratory specimens dur ing the acute phase of infection.  Positive  results are indicative of active infection with SARS-CoV-2.  Clinical  correlation with patient history and other diagnostic information is  necessary to determine patient infection status.  Positive results do  not rule out bacterial infection or co-infection with other viruses. If result is PRESUMPTIVE POSTIVE SARS-CoV-2 nucleic acids MAY BE PRESENT.   A presumptive positive result was obtained on the submitted specimen  and confirmed on repeat testing.  While 2019 novel coronavirus  (SARS-CoV-2) nucleic acids may be present in the submitted sample  additional confirmatory testing may be necessary for epidemiological  and / or clinical management purposes  to differentiate  between  SARS-CoV-2 and other Sarbecovirus currently known to infect humans.  If clinically indicated additional testing with an alternate test  methodology [phone]) is advised. The SARS-CoV-2 RNA is generally  detectable in upper and lower respiratory sp ecimens during the acute  phase of infection. The expected result is Negative. Fact Sheet for Patients:  StrictlyIdeas.no Fact Sheet for Healthcare Providers: BankingDealers.co.za This test is not yet approved or cleared by the [place] FDA and has been authorized for detection and/or diagnosis of SARS-CoV-2 by FDA under an Emergency Use Authorization (EUA).  This EUA will remain in effect (meaning this test can be used) for the duration of the COVID-19 declaration under Section 564(b)(1) of the Act, 21 U.S.C. section 360bbb-3(b)(1), unless the authorization is terminated or revoked sooner. Performed at [facility], [place] [address], [place], [place] [address]     Imaging: I have  reviewed his images from the Fluoro xray table on the lithotripsy unit demonstrating a small mid-left sided ureteral stone.  Imp: Incompletely treated left mid-ureteral stone with on-going poorly controlled pain.  Recommendations: Plan to proceed with left sided ESWL.  We discussed the risk/benefits and he has opted to proceed.     Ardis Hughs

## 2019-04-11 ENCOUNTER — Encounter (HOSPITAL_COMMUNITY): Payer: Self-pay | Admitting: Urology

## 2019-04-11 ENCOUNTER — Encounter (HOSPITAL_COMMUNITY): Payer: Self-pay | Admitting: Certified Registered Nurse Anesthetist

## 2019-09-11 ENCOUNTER — Other Ambulatory Visit: Payer: Self-pay

## 2019-09-11 ENCOUNTER — Telehealth: Payer: Self-pay | Admitting: *Deleted

## 2019-09-11 ENCOUNTER — Encounter: Payer: Self-pay | Admitting: Neurology

## 2019-09-11 ENCOUNTER — Ambulatory Visit: Payer: Medicare HMO | Admitting: Neurology

## 2019-09-11 VITALS — BP 121/70 | HR 101 | Temp 98.0°F | Ht 72.0 in | Wt 207.0 lb

## 2019-09-11 DIAGNOSIS — G43709 Chronic migraine without aura, not intractable, without status migrainosus: Secondary | ICD-10-CM

## 2019-09-11 DIAGNOSIS — IMO0002 Reserved for concepts with insufficient information to code with codable children: Secondary | ICD-10-CM

## 2019-09-11 MED ORDER — NORTRIPTYLINE HCL 25 MG PO CAPS: ORAL_CAPSULE | ORAL | 3 refills | Status: DC

## 2019-09-11 MED ORDER — EMGALITY 120 MG/ML ~~LOC~~ SOAJ: 1.0000 | SUBCUTANEOUS | 4 refills | Status: DC

## 2019-09-11 MED ORDER — RIZATRIPTAN BENZOATE 10 MG PO TABS: 10.0000 mg | ORAL_TABLET | ORAL | 4 refills | Status: DC | PRN

## 2019-09-11 NOTE — Telephone Encounter (Signed)
Submitted PA emgality on CMM. [id number]. Waiting on determination.  "Your information has been submitted to [place] Medicare Part D. [employer] Medicare Part D will review the request and will issue a decision, typically within 1-3 days from your submission. You can check the updated outcome later by reopening this request. If [employer] Part [employer] has not responded in 1-3 days or if you have any questions about your ePA request, please contact [place] Medicare Part D at [phone]. If you think there may be a problem with your PA request, use our live chat feature at the bottom right."

## 2019-09-11 NOTE — Telephone Encounter (Addendum)
PA approved under [employer] Medicare part D from 11/20/2018 - 12/10/2019. Faxed notification to [place] at [phone]. Received fax confirmation. Member ID Number: [facility]

## 2019-09-11 NOTE — Progress Notes (Signed)
[facility]  PATIENT: Nathaniel Madden DOB: [birth year]  REFERRING DOCTOR OR PCP:  None SOURCE: Patient, notes from [facility]  _________________________________   HISTORICAL  CHIEF COMPLAINT:  Chief Complaint  Patient presents with  . Follow-up    RM 12. Last seen 09/12/2018.   . Migraine    Taking nortriptyline 50mg  po qhs. 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   Update 09/11/2019: He is noting more migraines over the past 6 months or so.Nathaniel Madden   He now has about 15 headaches a month lasting > 4 hours a day, despite nortriptyline 50 mg (higher dose not tolerated).    He had done better for a while on nortriptyline but worse the past year.    Maxalt helps some migraines once present..     Meds tried:   Currently on nortriptyline/tricyclic with incomplete benefit (similar to amitriptyline but can't take that due to urinary hesitancy).  He also failed zonisamide/topiramate due to kidney stones.    Keppra had not helped much.  He also was on a beta blocker (bystolic and metoprolol at different times) in the past without benefit.    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     Update 09/12/2018: He feels migraines are better since he retired earlier this year.     He is having 5-10 migraines a month now that usually are present in the morning and associated with a congested feeling.   He denies N/V and no significant photophobia or phonophobia.     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         He sleeps well most nights.     Urinary hesitancy is doing well on Flomax.     He reports that memory has done better this year compared to last year.   Update 09/12/2017:     He is noting HA 10-15 days a month, usually over the left eye more than the  right side.    He can't identify any definite triggers though some occur with changes in barometric pressure.    Sometimes he wakes up with one.    He takes Maxalt for most of them and Excedrin Migraine if milder.     He feels that the nortriptyline is helping the headache frequency some. It has helped better than many other medications which have been tried. He cannot go on Topamax or zonisamide due to kidney stones.  He sleeps well at night.     He feels that his memory is doing better now than earlier this year. He still occasionally notes some reduced recall but this is not getting in the way of his job.   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 Focus and attention are reduced.  He notes difficulty keeping track of multiple problems a patient reports and is mkeeping   His father had Alzheimer's disease.   Today, we did the MoCA test and he scored 27/30 (-2 for recall and -1 for language).   He is working full time in [employer] and plans to work until age 27.   He notes no  change in gait or bladder.  He denies depression but maybe a little irritable at times.  He denies anxiety.   He sleeps well and he does not snore loudly and has never been noted to have OSA symptoms.    He sleeps better on nortriptyline.   Lab work was reviewed. The TSH and calcium were normal when last tested.    Headache:   Since the last visit, nortriptyline was added and he is doing better.  He tolerates it well and it is helping a little bit. However, he continues to have near daily headaches.   With the last Botox injections, he thought he got a benefit but he noticed some weakness with neck extension and had mild eyelid drooping..     In general, the headaches are left-sided with a pounding character. The headaches are usually associated with nausea, especially at the onset. Additionally he has  photophobia and phonophobia. Moving the head will make the headaches feel worse. He takes Maxalt when necessary. Maxalt will often help to eliminate the headache.  Chronic migraine medications tried and failed: Antiepileptics: Zonisamide/Topamax was associated with kidney stones. Keppra has not been of benefit and causes him to feel foggy. Tricyclics: He has benign prostatic hypertrophy. Beta blockers were noneffective in the past Muscle relaxants were noneffective in the past NSAIDs (multiple) have not been effective enough. 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 The headaches became more chronic about 15 years ago. At time, they have mostly occurred on a near daily basis. In the past, he has been on multiple medications as migraine prophylaxis. Zonisamide was tried but he developed kidney stones and had to be discontinued. Due to the kidney stone risk, Topamax cannot be used. Keppra was initiated and helped his headaches initially ever, due to low platelets the Keppra needed to be cut back. At the current dose of 500 mg daily, it is not helping the headaches and he also feels a little bit of a mental fog. About 4 years ago, he was on Botox and got up to 6 months benefit to a frequency  less than one migraine a week for a while.      REVIEW OF SYSTEMS: Constitutional: No fevers, chills, sweats, or change in appetite.   He sleeps well Eyes: No visual changes, double vision, eye pain Ear, nose and throat: No hearing loss, ear pain, nasal congestion, sore throat Cardiovascular: No chest pain, palpitations Respiratory: No shortness of breath at rest or with exertion.   No wheezes GastrointestinaI: No nausea, vomiting, diarrhea, abdominal pain, fecal incontinence Genitourinary: He has BPH. He has a history kidney stones.. Musculoskeletal: No neck pain, back pain Integumentary: No rash, pruritus, skin  lesions Neurological: as above Psychiatric: No depression at this time.  No anxiety Endocrine: No palpitations, diaphoresis, change in appetite, change in weigh or increased thirst Hematologic/Lymphatic: No anemia, purpura, petechiae. Allergic/Immunologic: No itchy/runny eyes, nasal congestion, recent allergic reactions, rashes  ALLERGIES: Allergies  Allergen Reactions  . Levofloxacin     angioedema  . Rocephin [Ceftriaxone Sodium In Dextrose]     angioedema    HOME MEDICATIONS:  Current Outpatient Medications:  .  albuterol (PROVENTIL HFA;VENTOLIN HFA) 108 (90 Base) MCG/ACT inhaler, Inhale into the lungs., Disp: , Rfl:  .  loratadine-pseudoephedrine (CLARITIN-D 24-HOUR) 10-240 MG 24 hr tablet, Take 1 tablet by mouth daily., Disp: , Rfl:  .  mometasone-formoterol (DULERA) 200-5 MCG/ACT  AERO, Inhale into the lungs., Disp: , Rfl:  .  nortriptyline (PAMELOR) 25 MG capsule, Take one or two qHS, Disp: 180 capsule, Rfl: 3 .  rizatriptan (MAXALT) 10 MG tablet, Take 1 tablet (10 mg total) by mouth as needed for migraine. May repeat in 2 hours if needed, Disp: 30 tablet, Rfl: 4 .  tamsulosin (FLOMAX) 0.4 MG CAPS capsule, Take 0.4 mg by mouth at bedtime., Disp: , Rfl:  .  amLODipine (NORVASC) 2.5 MG tablet, Take 2.5 mg by mouth daily., Disp: , Rfl:  .  esomeprazole (NEXIUM) 40 MG capsule, Take 40 mg by mouth., Disp: , Rfl:  .  Galcanezumab-gnlm (EMGALITY) 120 MG/ML SOAJ, Inject 1 pen into the skin every 28 (twenty-eight) days., Disp: 3 pen, Rfl: 4 .  rosuvastatin (CRESTOR) 10 MG tablet, Take 10 mg by mouth daily., Disp: , Rfl:   PAST MEDICAL HISTORY: Past Medical History:  Diagnosis Date  . Acid reflux   . Asthma   . Cancer ([place])   . High cholesterol   . History of kidney stones   . Hx of skin cancer, basal cell   . Hypertension   . Kidney stones   . Migraines   . Vision abnormalities     PAST SURGICAL HISTORY: Past Surgical History:  Procedure Laterality Date  .  CHOLECYSTECTOMY    . CYSTOSCOPY    . ELBOW FRACTURE SURGERY Bilateral   . EXTRACORPOREAL SHOCK WAVE LITHOTRIPSY Right 04/10/2019   Procedure: EXTRACORPOREAL SHOCK WAVE LITHOTRIPSY (ESWL);  Surgeon: Ardis Hughs, MD;  Location: [facility];  Service: Urology;  Laterality: Right;  . LITHOTRIPSY     x4    FAMILY HISTORY: Family History  Problem Relation Age of Onset  . Diabetes Mother   . Hypertension Mother   . Pneumonia Father   . Hypertension Father   . Diabetes Father   . Dementia Father     SOCIAL HISTORY:  Social History   Socioeconomic History  . Marital status: Married    Spouse name: Not on file  . Number of children: Not on file  . Years of education: Not on file  . Highest education level: Not on file  Occupational History  . Not on file  Social Needs  . Financial resource strain: Not on file  . Food insecurity    Worry: Not on file    Inability: Not on file  . Transportation needs    Medical: Not on file    Non-medical: Not on file  Tobacco Use  . Smoking status: Never Smoker  . Smokeless tobacco: Never Used  Substance and Sexual Activity  . Alcohol use: Not on file  . Drug use: Not on file  . Sexual activity: Not on file  Lifestyle  . Physical activity    Days per week: Not on file    Minutes per session: Not on file  . Stress: Not on file  Relationships  . Social Herbalist on phone: Not on file    Gets together: Not on file    Attends religious service: Not on file    Active member of club or organization: Not on file    Attends meetings of clubs or organizations: Not on file    Relationship status: Not on file  . Intimate partner violence    Fear of current or ex partner: Not on file    Emotionally abused: Not on file    Physically abused: Not on file  Forced sexual activity: Not on file  Other Topics Concern  . Not on file  Social History Narrative  . Not on file     PHYSICAL EXAM  Vitals:   09/11/19 0829  BP:  121/70  Pulse: (!) 101  Temp: 98 F (36.7 C)  SpO2: 97%  Weight: 207 lb (93.9 kg)  Height: 6' (1.829 m)    Body mass index is 28.07 kg/m.   General: The patient is well-developed and well-nourished and in no acute distress   Neck: The neck has good ROM.  The neck is nontender.  The occiput is nontender.  Neurologic Exam  Mental status: The patient is alert and oriented x 3 at the time of the examination. The patient has apparent normal recent and remote memory, with an apparently normal attention span and concentration ability.   Speech is normal.  Cranial nerves: Extraocular movements are full.  Facial strength is normal.  Trapezius strength is normal.    No obvious hearing deficits are noted.  Motor:  Muscle bulk is normal.   Tone is normal. Strength is  5 / 5 in all 4 extremities.    Coordination: Finger-nose-finger and heel-to-shin is performed well.  Gait and station: Station is normal.   Gait is normal. Tandem gait is normal.   Romberg is negative.      ASSESSMENT AND PLAN  Chronic migraine   1.    Because the headache frequency is increasing I will have him try Emgality.  A sample was provided for the loading dose and a prescription was sent to his pharmacy.  At least for the time being, continue nortriptyline 50 mg at bedtime for chronic migraine.     2.    Maxalt as needed migraine. 3.    He will return to see me in 6-12 months or as needed   Jacklyne Baik A. Felecia Shelling, MD, PhD [id number], [id number] PM Certified in Neurology, Clinical Neurophysiology, Sleep Medicine, Pain Medicine and Neuroimaging  [facility] [address] [place], [place] [address] [phone]

## 2019-09-15 ENCOUNTER — Ambulatory Visit: Payer: Medicare HMO | Admitting: Neurology

## 2019-12-11 ENCOUNTER — Telehealth: Payer: Self-pay | Admitting: *Deleted

## 2019-12-11 DIAGNOSIS — G43709 Chronic migraine without aura, not intractable, without status migrainosus: Secondary | ICD-10-CM

## 2019-12-11 DIAGNOSIS — IMO0002 Reserved for concepts with insufficient information to code with codable children: Secondary | ICD-10-CM

## 2019-12-11 NOTE — Telephone Encounter (Signed)
Submitted PA emgality on CMM. [id number]. Waiting on determination from [facility].

## 2019-12-11 NOTE — Telephone Encounter (Signed)
Received fax from [place] that [place] approved as non-formulary 11/21/19-11/19/20. Member ID: [id number].

## 2019-12-18 NOTE — Telephone Encounter (Signed)
Called to let him know I already completed PA 12/11/19 for [facility] and it was approved until 11/19/20. He should be able to get his medication. He verbalized understanding and appreciation for call. Nothing further needed.

## 2019-12-18 NOTE — Telephone Encounter (Signed)
Pt has called asking for a call from RN to discuss that his insurance will no longer cover Galcanezumab-gnlm (EMGALITY) 120 MG/ML SOAJ.  He was told to call and inquire about  Aimovig.  Please call

## 2019-12-24 NOTE — Telephone Encounter (Addendum)
Patient can call [employer] Patient Assistance Program at [phone] to see if he can get financial assistance. They also have a website: lillycares.[email or web address].   I called pt and relayed above info. He will look into this. If he does not qualify, he will call back to let me know.

## 2019-12-24 NOTE — Telephone Encounter (Signed)
Patient called in and stated the Emgality is $250 and he wants to know if he can be switched to Aimovig

## 2019-12-26 NOTE — Telephone Encounter (Signed)
We can switch him to [employer]

## 2019-12-26 NOTE — Telephone Encounter (Signed)
Pt called back and stated that he did not qualify for this Foundation and he is wanting to know if he can be switched to [place]. Please advise.

## 2019-12-26 NOTE — Telephone Encounter (Signed)
Dr. Felecia Shelling- are you ok with switching him to [place]? Emgality was approved but he has too high of copay and does not qualify for financial assistance

## 2019-12-29 MED ORDER — AIMOVIG 140 MG/ML ~~LOC~~ SOAJ: 140.0000 mg | SUBCUTANEOUS | 11 refills | Status: DC

## 2019-12-29 NOTE — Telephone Encounter (Signed)
Called and spoke with pt. Advised Dr. Felecia Shelling ok to switch him to [place]. Escribed rx to pharmacy. Asked that emgality be d/c'd. Advised it may require PA, we will work on this if so. Pt verbalized understanding.

## 2019-12-29 NOTE — Addendum Note (Signed)
Addended by: Hope Pigeon on: 12/29/2019 09:02 AM   Modules accepted: Orders

## 2019-12-30 ENCOUNTER — Telehealth: Payer: Self-pay | Admitting: *Deleted

## 2019-12-30 NOTE — Telephone Encounter (Signed)
Received fax from [place] that [place] approved 11/21/19-03/29/20. Faxed approval notice to [place] at [phone]. Received fax confirmation.

## 2019-12-30 NOTE — Telephone Encounter (Signed)
Submitted PA Aimovig on CMM. [id number]. Waiting on determination from [place] Medicare Part D.

## 2020-01-12 ENCOUNTER — Telehealth: Payer: Self-pay | Admitting: Neurology

## 2020-01-12 MED ORDER — GABAPENTIN 300 MG PO CAPS: 300.0000 mg | ORAL_CAPSULE | Freq: Every day | ORAL | 3 refills | Status: DC

## 2020-01-12 NOTE — Telephone Encounter (Signed)
Called pt back. He thinks Dr. Felecia Shelling prescribed gabapentin 300mg  po qhs prn when he followed him at [facility]. Wanting to know if Dr. Felecia Shelling is willing to prescribe this again. Advised I will send MD message and call back.

## 2020-01-12 NOTE — Telephone Encounter (Signed)
Called pt, advised Dr. Felecia Shelling approved gabapentin. E-scribed rx to [facility]. He verbalized understnaidng.

## 2020-01-12 NOTE — Telephone Encounter (Signed)
Pt called and LVM asking if the provider can prescribe Gabapentin for him again for his sleep. Please advise.

## 2020-01-12 NOTE — Telephone Encounter (Signed)
300 mg one po qHS   #90 with 3 refills

## 2020-03-29 ENCOUNTER — Telehealth: Payer: Self-pay | Admitting: *Deleted

## 2020-03-29 NOTE — Telephone Encounter (Signed)
Submitted PA Aimovig on CMM. [id number]. Received instant approval from [employer] effective 11/21/2019 - 11/19/2020.  Member ID Number: [facility]

## 2020-05-13 DIAGNOSIS — E039 Hypothyroidism, unspecified: Secondary | ICD-10-CM | POA: Insufficient documentation

## 2020-06-10 ENCOUNTER — Telehealth: Payer: Self-pay | Admitting: *Deleted

## 2020-06-10 NOTE — Telephone Encounter (Signed)
Noted, thank you

## 2020-06-10 NOTE — Telephone Encounter (Signed)
Pt has called back, he accepted the appointment for 07-26 and is aware to check in at 3:00 for the 3:30 appointment

## 2020-06-10 NOTE — Telephone Encounter (Signed)
Called, LVM for pt to call office back. Dr. Felecia Shelling can fit him in for an appt for evaluation of his RLS/migraines on 06/14/20 at 3:30pm, check in 3:00pm. Asked him to call office back to let us know if this appt works. If he calls and can take appt, please schedule him. Thank you!

## 2020-06-14 ENCOUNTER — Ambulatory Visit: Payer: Medicare HMO | Admitting: Neurology

## 2020-06-14 ENCOUNTER — Encounter: Payer: Self-pay | Admitting: Neurology

## 2020-06-14 VITALS — BP 152/91 | HR 92 | Ht 72.0 in | Wt 194.0 lb

## 2020-06-14 DIAGNOSIS — R451 Restlessness and agitation: Secondary | ICD-10-CM | POA: Diagnosis not present

## 2020-06-14 DIAGNOSIS — M546 Pain in thoracic spine: Secondary | ICD-10-CM | POA: Diagnosis not present

## 2020-06-14 DIAGNOSIS — R21 Rash and other nonspecific skin eruption: Secondary | ICD-10-CM | POA: Diagnosis not present

## 2020-06-14 DIAGNOSIS — G43709 Chronic migraine without aura, not intractable, without status migrainosus: Secondary | ICD-10-CM | POA: Diagnosis not present

## 2020-06-14 DIAGNOSIS — IMO0002 Reserved for concepts with insufficient information to code with codable children: Secondary | ICD-10-CM

## 2020-06-14 MED ORDER — DIAZEPAM 5 MG PO TABS: 5.0000 mg | ORAL_TABLET | Freq: Every evening | ORAL | 0 refills | Status: DC | PRN

## 2020-06-14 NOTE — Progress Notes (Signed)
[facility]  PATIENT: Nathaniel Madden DOB: 1951/01/21  REFERRING DOCTOR OR PCP:  None SOURCE: Patient, notes from [facility]  _________________________________   HISTORICAL  CHIEF COMPLAINT:  Chief Complaint  Patient presents with  . Follow-up    RM 12, with wife. Last seen 09/11/2019. Stopped taking gabapentin (caused agitation so he stopped on 06/08/20 and sx resolved/was taking for his back.   . Migraine    Takes Aimovig monthly, maxalt prn, nortiptyline  . 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   Update 06/14/2020:  He is having more restless leg type symptoms.    He has a scaly dermatitis with pruritis.   He was told its an allergic reaction or mycosis fungiodes but biopsies have not been diagnostic.     He had a sudden onset of upper back pain in the scapula region radiating to under the left breast.   Pain is intense with a burning/electric quality.   It comes on after he lays down.  Heat has helped some.    He does better during the day when he is up and about.    He denies numbness.     No leg weakness or arm symptoms.     He is sleeping poorly since this started.    He started gabapentin and Robaxin and got very restless, needing to pac.   He stopped both medications and it resolved.    He has taken some tramadol with only mild benefit (takes 2-3/day this past week)     He restarted nortriptyline.      Migraines are much better with Aimovig.    Update 09/11/2019: He is noting more migraines over the past 6 months or so.[name]   He now has about 15 headaches a month lasting > 4 hours a day, despite nortriptyline 50 mg (higher dose not tolerated).    He had done better for a while on nortriptyline but worse the past year.    Maxalt helps some migraines once present..     Meds tried:   Currently on  nortriptyline/tricyclic with incomplete benefit (similar to amitriptyline but can't take that due to urinary hesitancy).  He also failed zonisamide/topiramate due to kidney stones.    Keppra had not helped much.  He also was on a beta blocker (bystolic and metoprolol at different times) in the past without benefit.    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.     Update 09/12/2018: He feels migraines are better since he retired earlier this year.     He is having 5-10 migraines a month now that usually are present in the morning and associated with a congested feeling.   He denies N/V and no significant photophobia or phonophobia.     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         He sleeps well most nights.     Urinary hesitancy is doing well on Flomax.     He reports that memory has done better this year compared to last year.   Update 09/12/2017:     He is noting HA 10-15 days a month, usually over the left eye more than the right side.    He can't identify any definite  triggers though some occur with changes in barometric pressure.    Sometimes he wakes up with one.    He takes Maxalt for most of them and Excedrin Migraine if milder.     He feels that the nortriptyline is helping the headache frequency some. It has helped better than many other medications which have been tried. He cannot go on Topamax or zonisamide due to kidney stones.  He sleeps well at night.     He feels that his memory is doing better now than earlier this year. He still occasionally notes some reduced recall but this is not getting in the way of his job.   From 01/17/2017: Nathaniel Madden is a 69 yo man I have seen for chronic migraine headaches who is reporting more difficulty with memory and multitasking at work. Focus and attention are reduced.  He notes difficulty keeping track of multiple problems a patient reports and is mkeeping   His father  had Alzheimer's disease.   Today, we did the MoCA test and he scored 27/30 (-2 for recall and -1 for language).   He is working full time in [employer] and plans to work until age 64.   He notes no change in gait or bladder.  He denies depression but maybe a little irritable at times.  He denies anxiety.   He sleeps well and he does not snore loudly and has never been noted to have OSA symptoms.    He sleeps better on nortriptyline.   Lab work was reviewed. The TSH and calcium were normal when last tested.    Headache:   Since the last visit, nortriptyline was added and he is doing better.  He tolerates it well and it is helping a little bit. However, he continues to have near daily headaches.   With the last Botox injections, he thought he got a benefit but he noticed some weakness with neck extension and had mild eyelid drooping..     In general, the headaches are left-sided with a pounding character. The headaches are usually associated with nausea, especially at the onset. Additionally he has photophobia and phonophobia. Moving the head will make the headaches feel worse. He takes Maxalt when necessary. Maxalt will often help to eliminate the headache.  Chronic migraine medications tried and failed: Antiepileptics: Zonisamide/Topamax was associated with kidney stones. Keppra has not been of benefit and causes him to feel foggy. Tricyclics: He has benign prostatic hypertrophy. Beta blockers were noneffective in the past Muscle relaxants were noneffective in the past NSAIDs (multiple) have not been effective enough. Additionally, he has GERD  Nathaniel Madden began to experience headaches as a teenager but they were more intermittent when he was younger. The headaches became more chronic about 15 years ago. At time, they have mostly occurred on a near daily basis. In the past, he has been on multiple medications as migraine prophylaxis. Zonisamide was tried but he developed kidney stones and had to be  discontinued. Due to the kidney stone risk, Topamax cannot be used. Keppra was initiated and helped his headaches initially ever, due to low platelets the Keppra needed to be cut back. At the current dose of 500 mg daily, it is not helping the headaches and he also feels a little bit of a mental fog. About 4 years ago, he was on Botox and got up to 6 months benefit to a frequency  less than one migraine a week for a while.  REVIEW OF SYSTEMS: Constitutional: No fevers, chills, sweats, or change in appetite.   He sleeps well Eyes: No visual changes, double vision, eye pain Ear, nose and throat: No hearing loss, ear pain, nasal congestion, sore throat Cardiovascular: No chest pain, palpitations Respiratory: No shortness of breath at rest or with exertion.   No wheezes GastrointestinaI: No nausea, vomiting, diarrhea, abdominal pain, fecal incontinence Genitourinary: He has BPH. He has a history kidney stones.. Musculoskeletal: No neck pain, back pain Integumentary: No rash, pruritus, skin lesions Neurological: as above Psychiatric: No depression at this time.  No anxiety Endocrine: No palpitations, diaphoresis, change in appetite, change in weigh or increased thirst Hematologic/Lymphatic: No anemia, purpura, petechiae. Allergic/Immunologic: No itchy/runny eyes, nasal congestion, recent allergic reactions, rashes  ALLERGIES: Allergies  Allergen Reactions  . Levofloxacin     angioedema  . Rocephin [Ceftriaxone Sodium In Dextrose]     angioedema    HOME MEDICATIONS:  Current Outpatient Medications:  .  albuterol (PROVENTIL HFA;VENTOLIN HFA) 108 (90 Base) MCG/ACT inhaler, Inhale into the lungs., Disp: , Rfl:  .  Erenumab-aooe (AIMOVIG) 140 MG/ML SOAJ, Inject 140 mg into the skin every 30 (thirty) days., Disp: 140 mg, Rfl: 11 .  esomeprazole (NEXIUM) 20 MG capsule, Take 20 mg by mouth daily at 12 noon., Disp: , Rfl:  .  hydrochlorothiazide (MICROZIDE) 12.5 MG capsule, Take 12.5 mg  by mouth daily., Disp: , Rfl:  .  loratadine-pseudoephedrine (CLARITIN-D 24-HOUR) 10-240 MG 24 hr tablet, Take 1 tablet by mouth daily., Disp: , Rfl:  .  mometasone-formoterol (DULERA) 200-5 MCG/ACT AERO, Inhale 2 puffs into the lungs as needed. , Disp: , Rfl:  .  nortriptyline (PAMELOR) 25 MG capsule, Take one or two qHS, Disp: 180 capsule, Rfl: 3 .  rizatriptan (MAXALT) 10 MG tablet, Take 1 tablet (10 mg total) by mouth as needed for migraine. May repeat in 2 hours if needed, Disp: 30 tablet, Rfl: 4 .  rosuvastatin (CRESTOR) 10 MG tablet, Take 10 mg by mouth daily., Disp: , Rfl:  .  tamsulosin (FLOMAX) 0.4 MG CAPS capsule, Take 0.4 mg by mouth at bedtime., Disp: , Rfl:  .  diazepam (VALIUM) 5 MG tablet, Take 1 tablet (5 mg total) by mouth at bedtime as needed for anxiety., Disp: 30 tablet, Rfl: 0  PAST MEDICAL HISTORY: Past Medical History:  Diagnosis Date  . Acid reflux   . Asthma   . Cancer ([place])   . High cholesterol   . History of kidney stones   . Hx of skin cancer, basal cell   . Hypertension   . Kidney stones   . Migraines   . Vision abnormalities     PAST SURGICAL HISTORY: Past Surgical History:  Procedure Laterality Date  . CHOLECYSTECTOMY    . CYSTOSCOPY    . ELBOW FRACTURE SURGERY Bilateral   . EXTRACORPOREAL SHOCK WAVE LITHOTRIPSY Right 04/10/2019   Procedure: EXTRACORPOREAL SHOCK WAVE LITHOTRIPSY (ESWL);  Surgeon: Ardis Hughs, MD;  Location: [facility];  Service: Urology;  Laterality: Right;  . LITHOTRIPSY     x4    FAMILY HISTORY: Family History  Problem Relation Age of Onset  . Diabetes Mother   . Hypertension Mother   . Pneumonia Father   . Hypertension Father   . Diabetes Father   . Dementia Father     SOCIAL HISTORY:  Social History   Socioeconomic History  . Marital status: Married    Spouse name: Not on file  . Number of children: Not  on file  . Years of education: Not on file  . Highest education level: Not on file  Occupational  History  . Not on file  Tobacco Use  . Smoking status: Never Smoker  . Smokeless tobacco: Never Used  Substance and Sexual Activity  . Alcohol use: Not on file  . Drug use: Not on file  . Sexual activity: Not on file  Other Topics Concern  . Not on file  Social History Narrative  . Not on file   Social Determinants of Health   Financial Resource Strain:   . Difficulty of Paying Living Expenses:   Food Insecurity:   . Worried About Charity fundraiser in the Last Year:   . Arboriculturist in the Last Year:   Transportation Needs:   . Film/video editor (Medical):   [name] Lack of Transportation (Non-Medical):   Physical Activity:   . Days of Exercise per Week:   . Minutes of Exercise per Session:   Stress:   . Feeling of Stress :   Social Connections:   . Frequency of Communication with Friends and Family:   . Frequency of Social Gatherings with Friends and Family:   . Attends Religious Services:   . Active Member of Clubs or Organizations:   . Attends Archivist Meetings:   [name] Marital Status:   Intimate Partner Violence:   . Fear of Current or Ex-Partner:   . Emotionally Abused:   [name] Physically Abused:   . Sexually Abused:      PHYSICAL EXAM  Vitals:   06/14/20 1523  BP: (!) 152/91  Pulse: 92  Weight: 194 lb (88 kg)  Height: 6' (1.829 m)    Body mass index is 26.31 kg/m.   General: The patient is well-developed and well-nourished and in no acute distress he has a rash in the right leg.  No rash on the trunk.     Neck/Back: The neck has good ROM.  The neck is nontender.  The occiput is nontender.  He is mildly tender over the left rhomboid, mid thoracic paraspinal muscles and infraspinatus muscle  Neurologic Exam  Mental status: The patient is alert and oriented x 3 at the time of the examination. The patient has apparent normal recent and remote memory, with an apparently normal attention span and concentration ability.   Speech is  normal.  Cranial nerves: Extraocular movements are full.  Facial strength is normal.  Trapezius strength is normal.    No obvious hearing deficits are noted.  Motor:  Muscle bulk is normal.   Tone is normal. Strength is  5 / 5 in all 4 extremities.    Coordination: Finger-nose-finger and heel-to-shin is performed well.  Gait and station: Station is normal.   Gait is normal. Tandem gait is normal.   Romberg is negative.      ASSESSMENT AND PLAN  Acute left-sided thoracic back pain - Plan: MR THORACIC SPINE WO CONTRAST  Restlessness - Plan: MR THORACIC SPINE WO CONTRAST  Rash - Plan: MR THORACIC SPINE WO CONTRAST  Chronic migraine   1.    Continue nortriptyline.  Add Valium at night for muscle spasms and insomnia. 2.    Continue Aimovig for chronic migraine and Maxalt as needed for migraine. 3.    Trigger point injection left rhomboid major, T5-T6 paraspinal muscle and infraspinatus muscle with a total of 80 mg Depo-Medrol and 3 cc Marcaine.  He tolerated the injection well and there were  no complications.   4.    MRI thoracic spine 4 acute left thoracic spine to rule out herniated disc that could be causing a radiculopathy.   5.   He will return to see based on the results of the MRI or as needed   Kirah Stice A. Felecia Shelling, MD, PhD 12/29/1978, 2:21 PM Certified in Neurology, Clinical Neurophysiology, Sleep Medicine, Pain Medicine and Neuroimaging  [facility] [address] [place], [place] [address] [phone]

## 2020-06-15 ENCOUNTER — Telehealth: Payer: Self-pay | Admitting: Neurology

## 2020-06-15 NOTE — Telephone Encounter (Signed)
[employer] medicare order sent to [facility]. They will obtain the auth and reach out to the patient to schedule.  °

## 2020-06-16 ENCOUNTER — Telehealth: Payer: Self-pay | Admitting: Neurology

## 2020-06-16 ENCOUNTER — Other Ambulatory Visit: Payer: Self-pay

## 2020-06-16 ENCOUNTER — Ambulatory Visit
Admission: RE | Admit: 2020-06-16 | Discharge: 2020-06-16 | Disposition: A | Discharge status: Home / Self Care | Payer: Medicare HMO | Source: Ambulatory Visit | Attending: Neurology | Admitting: Neurology

## 2020-06-16 DIAGNOSIS — M546 Pain in thoracic spine: Secondary | ICD-10-CM

## 2020-06-16 DIAGNOSIS — R451 Restlessness and agitation: Secondary | ICD-10-CM

## 2020-06-16 DIAGNOSIS — R21 Rash and other nonspecific skin eruption: Secondary | ICD-10-CM

## 2020-06-16 NOTE — Telephone Encounter (Signed)
Patient is scheduled at [facility] for 06/16/20.

## 2020-06-16 NOTE — Telephone Encounter (Signed)
No the patient is not having MRI at [facility] now he already had it done at [facility] for today.[place] at [facility]. Is aware to cancel his appt.

## 2020-06-16 NOTE — Telephone Encounter (Signed)
Nathaniel Madden called need to call Nathaniel Madden [phone] to change to Nathaniel Madden, Nathaniel Madden [id number], Nathaniel Madden [phone] Tax ID: [id number].

## 2020-06-16 NOTE — Telephone Encounter (Signed)
Pt has called to report that his pain is worse, he would like a call from RN to discuss

## 2020-06-16 NOTE — Telephone Encounter (Signed)
I reviewed the MRI of the thoracic spine performed earlier today.  It does show some disc protrusions but there is no nerve root compression or spinal cord compression/spinal stenosis.  No osseous lesions are identified.  I discussed the findings.  He was able to sleep better with the Valium though it did not help the pain any.  He is going to retry gabapentin.

## 2020-06-16 NOTE — Telephone Encounter (Addendum)
Called pt back. He states Inj on Monday helped relieve pain temporarily that Dr. Felecia Shelling did. Pain started to get worse last night again. Valium helps him sleep through pain but he woke up again in pain. MRI scheduled for 06/27/20 at [facility] imaging. He is wanting to know if he can get in sooner. I placed him on hold and spoke with Raquel Sarna. She states pt unable to come to our office d/t him having Medicare. She sent message to manager at [facility] imaging to see if they can get him in sooner. If not, she will try to find somewhere else for him to get imaging done sooner. She will be in touch with the patient. I relayed this to the pt. He verbalized understanding.

## 2020-06-21 ENCOUNTER — Telehealth: Payer: Self-pay | Admitting: *Deleted

## 2020-06-21 ENCOUNTER — Other Ambulatory Visit: Payer: Self-pay | Admitting: Neurology

## 2020-06-21 MED ORDER — GABAPENTIN 300 MG PO CAPS: ORAL_CAPSULE | ORAL | 5 refills | Status: DC

## 2020-06-21 NOTE — Telephone Encounter (Signed)
Called and spoke with pt. Scheduled 2 week f/u per MD request for 07/05/20 at 1:30pm.

## 2020-06-27 ENCOUNTER — Other Ambulatory Visit: Payer: Medicare HMO

## 2020-07-05 ENCOUNTER — Encounter: Payer: Self-pay | Admitting: Neurology

## 2020-07-05 ENCOUNTER — Ambulatory Visit: Payer: Medicare HMO | Admitting: Neurology

## 2020-07-05 VITALS — BP 135/79 | HR 65 | Ht 72.0 in | Wt 195.0 lb

## 2020-07-05 DIAGNOSIS — IMO0002 Reserved for concepts with insufficient information to code with codable children: Secondary | ICD-10-CM

## 2020-07-05 DIAGNOSIS — R451 Restlessness and agitation: Secondary | ICD-10-CM

## 2020-07-05 DIAGNOSIS — G43709 Chronic migraine without aura, not intractable, without status migrainosus: Secondary | ICD-10-CM

## 2020-07-05 DIAGNOSIS — M546 Pain in thoracic spine: Secondary | ICD-10-CM | POA: Diagnosis not present

## 2020-07-05 MED ORDER — RIZATRIPTAN BENZOATE 10 MG PO TABS: 10.0000 mg | ORAL_TABLET | ORAL | 4 refills | Status: DC | PRN

## 2020-07-05 NOTE — Progress Notes (Signed)
[facility]  PATIENT: Nathaniel Madden DOB: [birth year]  REFERRING DOCTOR OR PCP:  None SOURCE: Patient, notes from [facility]  _________________________________   HISTORICAL  CHIEF COMPLAINT:  Chief Complaint  Patient presents with  . Follow-up    RM 13, alone. Last seen 06/14/2020. Taking nortriptyline, valium, aimovig, maxalt, gabapentin. Feels gabapentin has helped. Not taking notriptyline since he is taking gabapentin. Has burning pain in left shoulder/neck that gabapentin helps relieve.     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   Update 07/05/2020: He is still experiencing pain in the scapula region and other pain into the shoulder to the chest.   Pain is much better since starting gabapentin 300-600.    He stopped nortriptyline since the gabapentin was started as he felt he had some interaction.   He stopped the tramadol.    A;leve helps the myalgia.     The restlessness at night has resolved.   This only occurred a couple days.      Update 06/14/2020:  He is having more restless leg type symptoms.    He has a scaly dermatitis with pruritis.   He was told its an allergic reaction or mycosis fungiodes but biopsies have not been diagnostic.     He had a sudden onset of upper back pain in the scapula region radiating to under the left breast.   Pain is intense with a burning/electric quality.   It comes on after he lays down.  Heat has helped some.    He does better during the day when he is up and about.    He denies numbness.     No leg weakness or arm symptoms.     He is sleeping poorly since this started.    He started gabapentin and Robaxin and got very restless, needing to pac.   He stopped both medications and it resolved.    He has taken some tramadol with only mild benefit (takes 2-3/day this past week)     He restarted  nortriptyline.      Migraines are much better with Aimovig.    Update 09/11/2019: He is noting more migraines over the past 6 months or so.Nathaniel Madden   He now has about 15 headaches a month lasting > 4 hours a day, despite nortriptyline 50 mg (higher dose not tolerated).    He had done better for a while on nortriptyline but worse the past year.    Maxalt helps some migraines once present..     Meds tried:   Currently on nortriptyline/tricyclic with incomplete benefit (similar to amitriptyline but can't take that due to urinary hesitancy).  He also failed zonisamide/topiramate due to kidney stones.    Keppra had not helped much.  He also was on a beta blocker (bystolic and metoprolol at different times) in the past without benefit.    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     Update 09/12/2018: He feels migraines are better since he retired earlier this year.     He is having 5-10 migraines a month now that usually are present in the morning and associated with a congested feeling.   He denies N/V and no significant photophobia or phonophobia.     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         He sleeps well most nights.     Urinary hesitancy is doing well on Flomax.     He reports that memory has done better this year compared to last year.   Update 09/12/2017:     He is noting HA 10-15 days a month, usually over the left eye more than the right side.    He can't identify any definite triggers though some occur with changes in barometric pressure.    Sometimes he wakes up with one.    He takes Maxalt for most of them and Excedrin Migraine if milder.     He feels that the nortriptyline is helping the headache frequency some. It has helped better than many other medications which have been tried. He cannot go on Topamax or zonisamide due to kidney stones.  He sleeps well at night.     He feels that his memory is doing better  now than earlier this year. He still occasionally notes some reduced recall but this is not getting in the way of his job.   From 01/17/2017: Nathaniel Madden is a 69 yo man I have seen for chronic migraine headaches who is reporting more difficulty with memory and multitasking at work. Focus and attention are reduced.  He notes difficulty keeping track of multiple problems a patient reports and is mkeeping   His father had Alzheimer's disease.   Today, we did the MoCA test and he scored 27/30 (-2 for recall and -1 for language).   He is working full time in [employer] and plans to work until age 35.   He notes no change in gait or bladder.  He denies depression but maybe a little irritable at times.  He denies anxiety.   He sleeps well and he does not snore loudly and has never been noted to have OSA symptoms.    He sleeps better on nortriptyline.   Lab work was reviewed. The TSH and calcium were normal when last tested.    Headache:   Since the last visit, nortriptyline was added and he is doing better.  He tolerates it well and it is helping a little bit. However, he continues to have near daily headaches.   With the last Botox injections, he thought he got a benefit but he noticed some weakness with neck extension and had mild eyelid drooping..     In general, the headaches are left-sided with a pounding character. The headaches are usually associated with nausea, especially at the onset. Additionally he has photophobia and phonophobia. Moving the head will make the headaches feel worse. He takes Maxalt when necessary. Maxalt will often help to eliminate the headache.  Chronic migraine medications tried and failed: Antiepileptics: Zonisamide/Topamax was associated with kidney stones. Keppra has not been of benefit and causes him to feel foggy. Tricyclics: He has benign prostatic hypertrophy. Beta blockers were noneffective in the past Muscle relaxants were noneffective in the past NSAIDs  (multiple) have not been effective enough. 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 The headaches became more chronic about 15 years ago. At time, they have mostly occurred on a near daily basis. In the past, he has been on multiple medications as migraine prophylaxis. Zonisamide was tried but he developed kidney stones and had to be discontinued. Due to the kidney stone risk, Topamax cannot  be used. Keppra was initiated and helped his headaches initially ever, due to low platelets the Keppra needed to be cut back. At the current dose of 500 mg daily, it is not helping the headaches and he also feels a little bit of a mental fog. About 4 years ago, he was on Botox and got up to 6 months benefit to a frequency  less than one migraine a week for a while.      REVIEW OF SYSTEMS: Constitutional: No fevers, chills, sweats, or change in appetite.   He sleeps well Eyes: No visual changes, double vision, eye pain Ear, nose and throat: No hearing loss, ear pain, nasal congestion, sore throat Cardiovascular: No chest pain, palpitations Respiratory: No shortness of breath at rest or with exertion.   No wheezes GastrointestinaI: No nausea, vomiting, diarrhea, abdominal pain, fecal incontinence Genitourinary: He has BPH. He has a history kidney stones.. Musculoskeletal: No neck pain, back pain Integumentary: No rash, pruritus, skin lesions Neurological: as above Psychiatric: No depression at this time.  No anxiety Endocrine: No palpitations, diaphoresis, change in appetite, change in weigh or increased thirst Hematologic/Lymphatic: No anemia, purpura, petechiae. Allergic/Immunologic: No itchy/runny eyes, nasal congestion, recent allergic reactions, rashes  ALLERGIES: Allergies  Allergen Reactions  . Levofloxacin     angioedema  . Rocephin [Ceftriaxone Sodium In Dextrose]     angioedema    HOME  MEDICATIONS:  Current Outpatient Medications:  .  albuterol (PROVENTIL HFA;VENTOLIN HFA) 108 (90 Base) MCG/ACT inhaler, Inhale into the lungs., Disp: , Rfl:  .  Erenumab-aooe (AIMOVIG) 140 MG/ML SOAJ, Inject 140 mg into the skin every 30 (thirty) days., Disp: 140 mg, Rfl: 11 .  esomeprazole (NEXIUM) 20 MG capsule, Take 20 mg by mouth daily at 12 noon., Disp: , Rfl:  .  gabapentin (NEURONTIN) 300 MG capsule, One po q AM, one po q PM and 2 po qHS, Disp: 120 capsule, Rfl: 5 .  hydrochlorothiazide (MICROZIDE) 12.5 MG capsule, Take 12.5 mg by mouth daily., Disp: , Rfl:  .  loratadine-pseudoephedrine (CLARITIN-D 24-HOUR) 10-240 MG 24 hr tablet, Take 1 tablet by mouth daily., Disp: , Rfl:  .  mometasone-formoterol (DULERA) 200-5 MCG/ACT AERO, Inhale 2 puffs into the lungs as needed. , Disp: , Rfl:  .  naproxen sodium (ALEVE) 220 MG tablet, Take 220 mg by mouth as needed., Disp: , Rfl:  .  rizatriptan (MAXALT) 10 MG tablet, Take 1 tablet (10 mg total) by mouth as needed for migraine. May repeat in 2 hours if needed, Disp: 30 tablet, Rfl: 4 .  rosuvastatin (CRESTOR) 10 MG tablet, Take 10 mg by mouth daily., Disp: , Rfl:  .  tamsulosin (FLOMAX) 0.4 MG CAPS capsule, Take 0.4 mg by mouth at bedtime., Disp: , Rfl:  .  diazepam (VALIUM) 5 MG tablet, Take 1 tablet (5 mg total) by mouth at bedtime as needed for anxiety. (Patient not taking: Reported on 07/05/2020), Disp: 30 tablet, Rfl: 0 .  nortriptyline (PAMELOR) 25 MG capsule, Take one or two qHS (Patient not taking: Reported on 07/05/2020), Disp: 180 capsule, Rfl: 3  PAST MEDICAL HISTORY: Past Medical History:  Diagnosis Date  . Acid reflux   . Asthma   . Cancer ([place])   . High cholesterol   . History of kidney stones   . Hx of skin cancer, basal cell   . Hypertension   . Kidney stones   . Migraines   . Vision abnormalities     PAST SURGICAL HISTORY: Past Surgical History:  Procedure Laterality Date  . CHOLECYSTECTOMY    . CYSTOSCOPY    . ELBOW  FRACTURE SURGERY Bilateral   . EXTRACORPOREAL SHOCK WAVE LITHOTRIPSY Right 04/10/2019   Procedure: EXTRACORPOREAL SHOCK WAVE LITHOTRIPSY (ESWL);  Surgeon: Ardis Hughs, MD;  Location: [facility];  Service: Urology;  Laterality: Right;  . LITHOTRIPSY     x4    FAMILY HISTORY: Family History  Problem Relation Age of Onset  . Diabetes Mother   . Hypertension Mother   . Pneumonia Father   . Hypertension Father   . Diabetes Father   . Dementia Father     SOCIAL HISTORY:  Social History   Socioeconomic History  . Marital status: Married    Spouse name: Not on file  . Number of children: Not on file  . Years of education: Not on file  . Highest education level: Not on file  Occupational History  . Not on file  Tobacco Use  . Smoking status: Never Smoker  . Smokeless tobacco: Never Used  Substance and Sexual Activity  . Alcohol use: Not on file  . Drug use: Not on file  . Sexual activity: Not on file  Other Topics Concern  . Not on file  Social History Narrative  . Not on file   Social Determinants of Health   Financial Resource Strain:   . Difficulty of Paying Living Expenses:   Food Insecurity:   . Worried About Charity fundraiser in the Last Year:   . Arboriculturist in the Last Year:   Transportation Needs:   . Film/video editor (Medical):   Nathaniel Madden Lack of Transportation (Non-Medical):   Physical Activity:   . Days of Exercise per Week:   . Minutes of Exercise per Session:   Stress:   . Feeling of Stress :   Social Connections:   . Frequency of Communication with Friends and Family:   . Frequency of Social Gatherings with Friends and Family:   . Attends Religious Services:   . Active Member of Clubs or Organizations:   . Attends Archivist Meetings:   Nathaniel Madden Marital Status:   Intimate Partner Violence:   . Fear of Current or Ex-Partner:   . Emotionally Abused:   Nathaniel Madden Physically Abused:   . Sexually Abused:      PHYSICAL EXAM  Vitals:    07/05/20 1329  BP: 135/79  Pulse: 65  Weight: 195 lb (88.5 kg)  Height: 6' (1.829 m)    Body mass index is 26.45 kg/m.   General: The patient is well-developed and well-nourished and in no acute distress he has a rash in the right leg.  No rash on the trunk.     Neck/Back: The neck has good ROM.  The neck is nontender.  The occiput is nontender.  He has mild tenderness over the left rhomboid and the mid thoracic paraspinal muscles.  Neurologic Exam  Mental status: The patient is alert and oriented x 3 at the time of the examination. The patient has apparent normal recent and remote memory, with an apparently normal attention span and concentration ability.   Speech is normal.  Cranial nerves: Extraocular movements are full.  Facial strength is normal.  Trapezius strength is normal.    No obvious hearing deficits are noted.  Motor:  Muscle bulk is normal.   Tone is normal. Strength is  5 / 5 in all 4 extremities.    Sensory: Slight allodynia from around T2-T8 on  the left near the spine  Gait and station: Station is normal.   Gait is normal. Tandem gait is normal.   Romberg is negative.      ASSESSMENT AND PLAN  Acute left-sided thoracic back pain  Restlessness  Chronic migraine   1.    Continue gabapentin 300 in the morning and 600 at night.  Dose can be pushed up if needed.  If the pain resolves, consider reducing the gabapentin or tapering it off. 2.    Continue Aimovig for chronic migraine and Maxalt as needed for migraine. 3.     He will return to see me in 1 year or sooner for new or worsening neurologic symptoms.  Kaleeah Gingerich A. Felecia Shelling, MD, PhD 9/35/7017, 7:93 PM Certified in Neurology, Clinical Neurophysiology, Sleep Medicine, Pain Medicine and Neuroimaging  [facility] [address], [place], [place] [address] [phone]

## 2020-09-13 ENCOUNTER — Ambulatory Visit: Payer: Medicare HMO | Admitting: Neurology

## 2020-10-11 IMAGING — DX ABDOMEN - 1 VIEW
2 series · 2 of 2 positions shown · non-contrast
Comparison: None.

CLINICAL DATA: Right-sided renal calculus, pre lithotripsy
evaluation

EXAM:
ABDOMEN - 1 VIEW

[abdomen kub (1 of 2)]
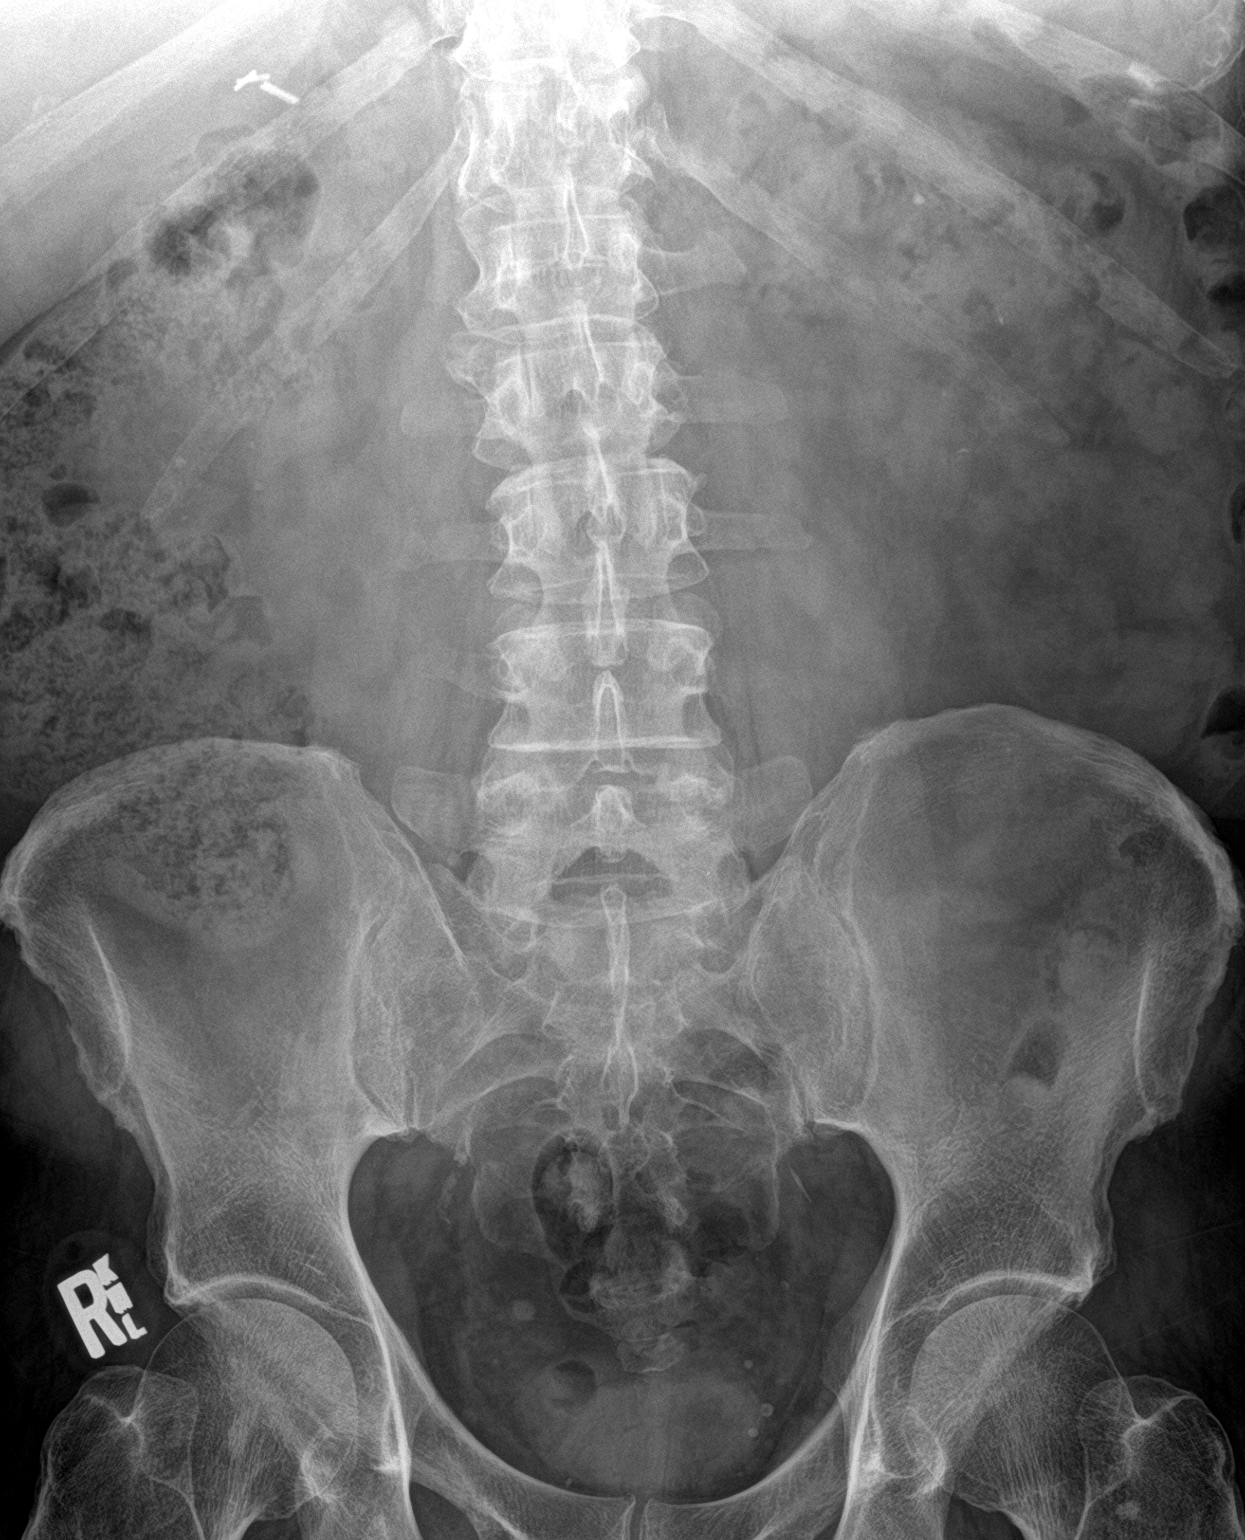

[abdomen kub (2 of 2)]
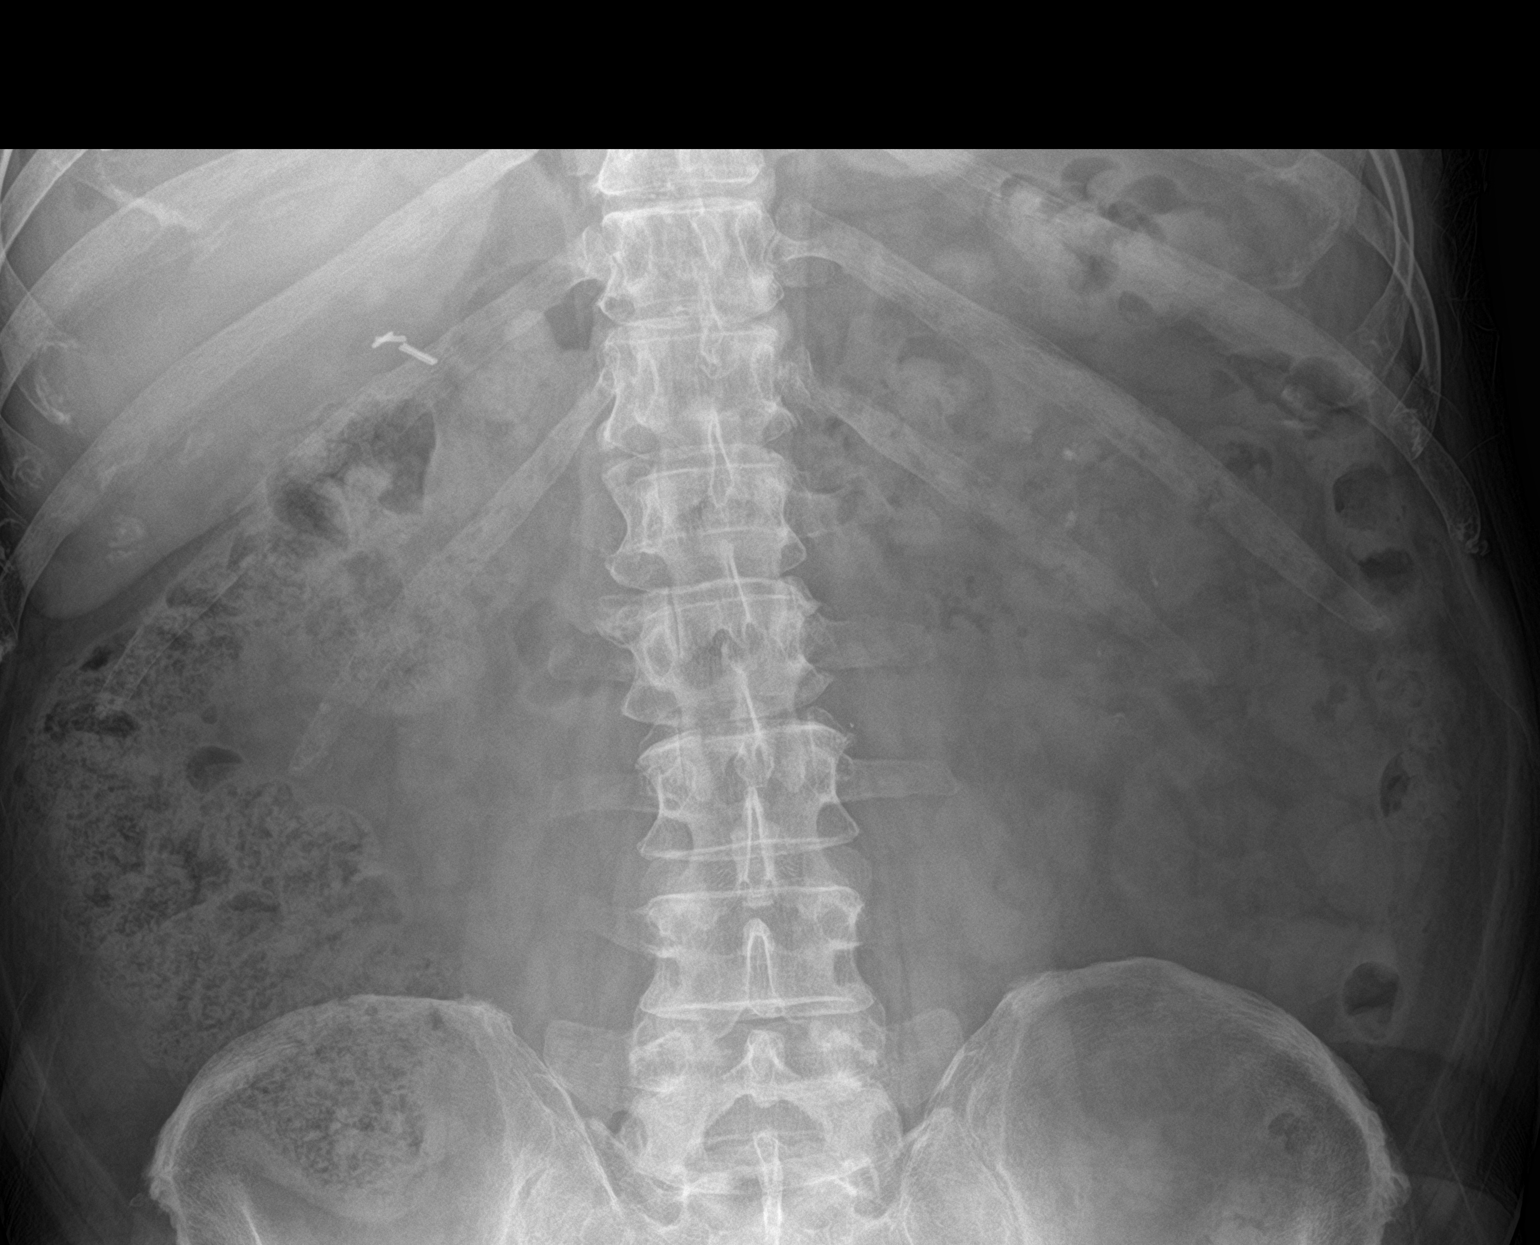

[2 of 2 positions shown; findings below may reference images not displayed]

FINDINGS: Scattered large and small bowel gas is noted. Multiple small renal
calculi are identified bilaterally throughout both kidneys. No
definitive renal stones are seen. Multiple phleboliths are noted
within the pelvis. There is a rounded calcification noted in the
right hemipelvis larger than the adjacent phleboliths. This could
represent a distal ureteral stone although no prior exams are
available for comparison.
IMPRESSION: Multiple bilateral renal calculi measuring less than 5 mm.

7 mm rounded calcification in the right hemipelvis which could
represent a distal ureteral stone. Correlation with any prior films
would be helpful.

## 2020-12-01 ENCOUNTER — Telehealth: Payer: Self-pay

## 2020-12-01 NOTE — Telephone Encounter (Signed)
I started a PA on CMM,Key: [id number], and got this message: The patient currently has access to the requested medication and a Prior Authorization is not needed for the patient/medication.

## 2020-12-28 ENCOUNTER — Other Ambulatory Visit: Payer: Self-pay | Admitting: Neurology

## 2021-03-08 ENCOUNTER — Other Ambulatory Visit: Payer: Self-pay | Admitting: Neurology

## 2021-03-11 ENCOUNTER — Telehealth: Payer: Self-pay | Admitting: Neurology

## 2021-03-11 NOTE — Telephone Encounter (Signed)
Pt called, [place] notified me that my prescription for nortriptyline (PAMELOR) 25 MG capsule was denied due to me no longer taking the medication. Wanted to let you know I do take my medication. Would like a call from the nurse.

## 2021-03-14 MED ORDER — NORTRIPTYLINE HCL 25 MG PO CAPS: ORAL_CAPSULE | ORAL | 0 refills | Status: DC

## 2021-03-14 NOTE — Telephone Encounter (Signed)
Called pt back. He has been taking 1 po qhs typically. Would like rx for 1-2 po qhs in case he needs two. Pharmacy: [facility]. E-scribed refill to [facility]. His next follow up is 07/05/21 at 1:00pm.

## 2021-03-14 NOTE — Telephone Encounter (Signed)
Pt returned call. Please call back when available. 

## 2021-03-14 NOTE — Telephone Encounter (Signed)
LVM returning pt call. Wanted to confirm how he was taking nortriptyline, we have not refilled since 2020. Also wanted to confirm which pharmacy

## 2021-07-05 ENCOUNTER — Encounter: Payer: Self-pay | Admitting: Neurology

## 2021-07-05 ENCOUNTER — Ambulatory Visit (INDEPENDENT_AMBULATORY_CARE_PROVIDER_SITE_OTHER): Payer: Medicare HMO | Admitting: Neurology

## 2021-07-05 VITALS — BP 144/88 | HR 88 | Ht 72.0 in | Wt 207.5 lb

## 2021-07-05 DIAGNOSIS — G43009 Migraine without aura, not intractable, without status migrainosus: Secondary | ICD-10-CM

## 2021-07-05 DIAGNOSIS — M546 Pain in thoracic spine: Secondary | ICD-10-CM

## 2021-07-05 DIAGNOSIS — R208 Other disturbances of skin sensation: Secondary | ICD-10-CM

## 2021-07-05 MED ORDER — NORTRIPTYLINE HCL 25 MG PO CAPS: ORAL_CAPSULE | ORAL | 3 refills | Status: DC

## 2021-07-05 MED ORDER — RIZATRIPTAN BENZOATE 10 MG PO TABS: 10.0000 mg | ORAL_TABLET | ORAL | 4 refills | Status: DC | PRN

## 2021-07-05 MED ORDER — AIMOVIG 140 MG/ML ~~LOC~~ SOAJ: SUBCUTANEOUS | 11 refills | Status: DC

## 2021-07-05 NOTE — Progress Notes (Signed)
[facility]  PATIENT: Nathaniel Madden DOB: [birth year]  REFERRING DOCTOR OR PCP:  None SOURCE: Patient, notes from [facility]  _________________________________   HISTORICAL  CHIEF COMPLAINT:  Chief Complaint  Patient presents with   Follow-up    Rm 2, alone. Here for yearly f/u. Pt is no longer having thoracic pn. He is now having LB pain. Pt states taking 1 aleve a day. Pt states being stiff and has to slowly straighten back when standing up. Pt needs refills today.    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     He is still experiencing mild pain in the scapula region but it is better on gabapentin.    Initially, he stopped nortriptyline but resumed it for better headache control.    Recently, he has had more lower back pain, better after moving around.  Sitting a long time increases her pain.   NSAIDs are helping  On gabapentin, he is not having restless leg syndrome.      Chronic migraine medications tried and failed: Antiepileptics: Zonisamide/Topamax was associated with kidney stones. Keppra has not been of benefit and causes him to feel foggy. Tricyclics: He has benign prostatic hypertrophy. Beta blockers were noneffective in the past Muscle relaxants were noneffective in the past NSAIDs (multiple) have not been effective enough. 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 The headaches became more chronic about 15 years ago. At time, they have mostly occurred on a near daily basis. In the past, he has been on multiple medications as migraine prophylaxis. Zonisamide was tried but he developed kidney stones and had to be discontinued. Due to the  kidney stone risk, Topamax cannot be used. Keppra was initiated and helped his headaches initially ever, due to low platelets the Keppra needed to be cut back. At the current dose of 500 mg daily, it is not helping the headaches and he also feels a little bit of a mental fog. About 4 years ago, he was on Botox and got up to 6 months benefit to a frequency  less than one migraine a week for a while.      REVIEW OF SYSTEMS: Constitutional: No fevers, chills, sweats, or change in appetite.   He sleeps well Eyes: No visual changes, double vision, eye pain Ear, nose and throat: No hearing loss, ear pain, nasal congestion, sore throat Cardiovascular: No chest pain, palpitations Respiratory:  No shortness of breath at rest or with exertion.   No wheezes GastrointestinaI: No nausea, vomiting, diarrhea, abdominal pain, fecal incontinence Genitourinary:  He has BPH. He has a history kidney stones.. Musculoskeletal:  No neck pain, back pain Integumentary: No rash, pruritus, skin lesions Neurological: as above Psychiatric: No depression at this time.  No anxiety Endocrine: No palpitations, diaphoresis, change in appetite, change in weigh or increased thirst Hematologic/Lymphatic:  No anemia, purpura, petechiae. Allergic/Immunologic: No itchy/runny eyes, nasal congestion, recent allergic reactions, rashes  ALLERGIES: Allergies  Allergen Reactions   Levofloxacin     angioedema   Rocephin [Ceftriaxone Sodium In Dextrose]     angioedema    HOME MEDICATIONS:  Current Outpatient Medications:    albuterol (  PROVENTIL HFA;VENTOLIN HFA) 108 (90 Base) MCG/ACT inhaler, Inhale 2 puffs into the lungs as needed., Disp: , Rfl:    DUPIXENT 300 MG/2ML prefilled syringe, Inject 300 mg into the skin every 14 (fourteen) days., Disp: , Rfl:    esomeprazole (NEXIUM) 20 MG capsule, Take 20 mg by mouth daily at 12 noon., Disp: , Rfl:    gabapentin (NEURONTIN) 300 MG capsule, One po q AM, one po q PM and 2 po qHS  (Patient taking differently: Take 300 mg by mouth as needed (for sleep).), Disp: 120 capsule, Rfl: 5   hydrochlorothiazide (MICROZIDE) 12.5 MG capsule, Take 12.5 mg by mouth daily., Disp: , Rfl:    levothyroxine (SYNTHROID) 88 MCG tablet, Take 1 tablet by mouth daily., Disp: , Rfl:    loratadine-pseudoephedrine (CLARITIN-D 24-HOUR) 10-240 MG 24 hr tablet, Take 1 tablet by mouth daily., Disp: , Rfl:    Multiple Vitamins-Minerals (PRESERVISION AREDS PO), Take 1 tablet by mouth 2 (two) times daily., Disp: , Rfl:    naproxen sodium (ALEVE) 220 MG tablet, Take 220 mg by mouth as needed., Disp: , Rfl:    rosuvastatin (CRESTOR) 10 MG tablet, Take 10 mg by mouth daily., Disp: , Rfl:    tamsulosin (FLOMAX) 0.4 MG CAPS capsule, Take 0.4 mg by mouth at bedtime., Disp: , Rfl:    Erenumab-aooe (AIMOVIG) 140 MG/ML SOAJ, inject '140mg'$  into the skin every 30 days, Disp: 1 mL, Rfl: 11   nortriptyline (PAMELOR) 25 MG capsule, Take one or two qHS, Disp: 180 capsule, Rfl: 3   rizatriptan (MAXALT) 10 MG tablet, Take 1 tablet (10 mg total) by mouth as needed for migraine. May repeat in 2 hours if needed, Disp: 30 tablet, Rfl: 4  PAST MEDICAL HISTORY: Past Medical History:  Diagnosis Date   Acid reflux    Asthma    Cancer ([place])    High cholesterol    History of kidney stones    Hx of skin cancer, basal cell    Hypertension    Kidney stones    Migraines    Vision abnormalities     PAST SURGICAL HISTORY: Past Surgical History:  Procedure Laterality Date   CHOLECYSTECTOMY     CYSTOSCOPY     ELBOW FRACTURE SURGERY Bilateral    EXTRACORPOREAL SHOCK WAVE LITHOTRIPSY Right 04/10/2019   Procedure: EXTRACORPOREAL SHOCK WAVE LITHOTRIPSY (ESWL);  Surgeon: Ardis Hughs, MD;  Location: [facility];  Service: Urology;  Laterality: Right;   LITHOTRIPSY     x4    FAMILY HISTORY: Family History  Problem Relation Age of Onset   Diabetes Mother    Hypertension Mother    Pneumonia Father    Hypertension Father     Diabetes Father    Dementia Father     SOCIAL HISTORY:  Social History   Socioeconomic History   Marital status: Married    Spouse name: Not on file   Number of children: Not on file   Years of education: Not on file   Highest education level: Not on file  Occupational History   Not on file  Tobacco Use   Smoking status: Never   Smokeless tobacco: Never  Substance and Sexual Activity   Alcohol use: Not on file   Drug use: Not on file   Sexual activity: Not on file  Other Topics Concern   Not on file  Social History Narrative   Not on file   Social Determinants of Health   Financial Resource Strain: Not on file  Food Insecurity: Not on file  Transportation Needs: Not on file  Physical Activity: Not on file  Stress: Not on file  Social Connections: Not on file  Intimate Partner Violence: Not on file     PHYSICAL EXAM  Vitals:   07/05/21 1304  BP: (!) 144/88  Pulse: 88  Weight: 207 lb 8 oz (94.1 kg)  Height: 6' (1.829 m)    Body mass index is 28.14 kg/m.   General: The patient is well-developed and well-nourished and in no acute distress he has a rash in the right leg.  No rash on the trunk.     Neck/Back: The neck has good ROM.  The neck is nontender.  The occiput is nontender.  He has mild tenderness over the left rhomboid and the mid thoracic paraspinal muscles.  Neurologic Exam  Mental status: The patient is alert and oriented x 3 at the time of the examination. The patient has apparent normal recent and remote memory, with an apparently normal attention span and concentration ability.   Speech is normal.  Cranial nerves: Extraocular movements are full.  Facial strength is normal.  Trapezius strength is normal.    No obvious hearing deficits are noted.  Motor:  Muscle bulk is normal.   Tone is normal.  Strength was normal.  Sensory: Slight allodynia from around T2-T8 on the left near the spine  Gait and station: Station is normal.   Gait is normal.  Tandem gait is normal.   Romberg is negative.      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    Continue gabapentin for the dysesthetic pain.   2.    Continue Aimovig and nortriptyline for chronic migraine and Maxalt as needed for breakthrough migraine. 3.     He will return to see me in 1 year or sooner for new or worsening neurologic symptoms.  Everrett Lacasse A. Felecia Shelling, MD, PhD [id number], 0000000 PM Certified in Neurology, Clinical Neurophysiology, Sleep Medicine, Pain Medicine and Neuroimaging  [facility] [address] [place], [place] [address] [phone]

## 2022-02-22 DIAGNOSIS — Z951 Presence of aortocoronary bypass graft: Secondary | ICD-10-CM | POA: Insufficient documentation

## 2022-03-22 DIAGNOSIS — I25810 Atherosclerosis of coronary artery bypass graft(s) without angina pectoris: Secondary | ICD-10-CM | POA: Insufficient documentation

## 2022-03-22 DIAGNOSIS — I480 Paroxysmal atrial fibrillation: Secondary | ICD-10-CM | POA: Insufficient documentation

## 2022-07-06 ENCOUNTER — Ambulatory Visit: Payer: Medicare HMO | Admitting: Neurology

## 2022-07-06 ENCOUNTER — Encounter: Payer: Self-pay | Admitting: Neurology

## 2022-07-06 VITALS — BP 132/79 | HR 89 | Ht 72.0 in | Wt 206.0 lb

## 2022-07-06 DIAGNOSIS — G43009 Migraine without aura, not intractable, without status migrainosus: Secondary | ICD-10-CM

## 2022-07-06 DIAGNOSIS — R0683 Snoring: Secondary | ICD-10-CM | POA: Insufficient documentation

## 2022-07-06 DIAGNOSIS — I252 Old myocardial infarction: Secondary | ICD-10-CM

## 2022-07-06 DIAGNOSIS — M549 Dorsalgia, unspecified: Secondary | ICD-10-CM | POA: Diagnosis not present

## 2022-07-06 DIAGNOSIS — G4719 Other hypersomnia: Secondary | ICD-10-CM

## 2022-07-06 MED ORDER — GABAPENTIN 300 MG PO CAPS: ORAL_CAPSULE | ORAL | 5 refills | Status: DC

## 2022-07-06 MED ORDER — NORTRIPTYLINE HCL 25 MG PO CAPS: ORAL_CAPSULE | ORAL | 3 refills | Status: DC

## 2022-07-06 MED ORDER — RIZATRIPTAN BENZOATE 10 MG PO TABS
10.0000 mg | ORAL_TABLET | ORAL | 4 refills | Status: DC | PRN
Start: 2022-07-06 — End: 2023-07-03

## 2022-07-06 NOTE — Progress Notes (Signed)
[facility]  PATIENT: Nathaniel Madden DOB: [birth year]  REFERRING DOCTOR OR PCP:  None SOURCE: Patient, notes from [facility]  _________________________________   HISTORICAL  CHIEF COMPLAINT:  Chief Complaint  Patient presents with   Follow-up    RM 1 alone here for yearly f/u. 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   Aimovig was d/c by cardiology.  PI reviewed --- no C.I. but not studied in CAD population      His migraines occur 3-4 times a month and a triptan is helping.      He snores and has had some EDS, more than before.   On gabapentin, he is not having restless leg syndrome.      EPWORTH SLEEPINESS SCALE  On a scale of 0 - 3 what is the chance of dozing:  Sitting and Reading:   2 Watching TV:     2 Sitting inactive in a public place: 1 Passenger in car for one hour: 1 Lying down to rest in the afternoon: 3 Sitting and talking to someone: 1 Sitting quietly after lunch:  2 In a car, stopped in traffic:  0  Total (out of 24):   12/24 (mild EDS)  Neck/upper back pain He takes Aleve a times.    Only takes gabapentin some nights now.      Chronic migraine medications tried and failed: Antiepileptics: Zonisamide/Topamax was associated with kidney stones. Keppra has not been of benefit and causes him to feel foggy. Tricyclics: He has benign prostatic hypertrophy. Beta blockers were noneffective in the past Muscle relaxants were noneffective in the past NSAIDs (multiple) have not been effective enough. 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 The headaches became more chronic about  15 years ago. At time, they have mostly occurred on a near daily basis. In the past, he has been on multiple medications as migraine prophylaxis. Zonisamide was tried but he developed kidney stones and had to be discontinued. Due to the kidney stone risk, Topamax cannot be used. Keppra was initiated and helped his headaches initially ever, due to low platelets the Keppra needed to be cut back. At the current dose of 500 mg daily, it is not helping the headaches and he also feels a little bit of a mental fog. About 4 years ago, he was on Botox and got up to 6 months benefit to a frequency  less than one migraine a week for a while.      REVIEW OF SYSTEMS: Constitutional: No fevers, chills, sweats, or change in appetite.   He sleeps well Eyes: No visual changes, double vision, eye pain Ear, nose and throat: No hearing loss, ear pain, nasal congestion, sore throat Cardiovascular: No chest pain, palpitations Respiratory:  No shortness of breath at rest or with exertion.   No wheezes GastrointestinaI: No nausea, vomiting, diarrhea, abdominal pain, fecal incontinence Genitourinary:  He has BPH. He has a history kidney stones.. Musculoskeletal:  No neck pain, back pain Integumentary: No rash, pruritus, skin lesions Neurological: as above  Psychiatric: No depression at this time.  No anxiety Endocrine: No palpitations, diaphoresis, change in appetite, change in weigh or increased thirst Hematologic/Lymphatic:  No anemia, purpura, petechiae. Allergic/Immunologic: No itchy/runny eyes, nasal congestion, recent allergic reactions, rashes  ALLERGIES: Allergies  Allergen Reactions   Levofloxacin     angioedema   Rocephin [Ceftriaxone Sodium In Dextrose]     angioedema    HOME MEDICATIONS:  Current Outpatient Medications:    albuterol (PROVENTIL HFA;VENTOLIN HFA) 108 (90 Base) MCG/ACT inhaler, Inhale 2 puffs into the lungs as needed., Disp: , Rfl:    DUPIXENT 300 MG/2ML prefilled syringe, Inject 300  mg into the skin every 14 (fourteen) days., Disp: , Rfl:    esomeprazole (NEXIUM) 20 MG capsule, Take 20 mg by mouth daily at 12 noon., Disp: , Rfl:    gabapentin (NEURONTIN) 300 MG capsule, One po q AM, one po q PM and 2 po qHS (Patient taking differently: Take 300 mg by mouth as needed (for sleep).), Disp: 120 capsule, Rfl: 5   hydrochlorothiazide (MICROZIDE) 12.5 MG capsule, Take 12.5 mg by mouth daily., Disp: , Rfl:    levothyroxine (SYNTHROID) 88 MCG tablet, Take 1 tablet by mouth daily., Disp: , Rfl:    loratadine-pseudoephedrine (CLARITIN-D 24-HOUR) 10-240 MG 24 hr tablet, Take 1 tablet by mouth daily., Disp: , Rfl:    Multiple Vitamins-Minerals (PRESERVISION AREDS PO), Take 1 tablet by mouth 2 (two) times daily., Disp: , Rfl:    naproxen sodium (ALEVE) 220 MG tablet, Take 220 mg by mouth as needed., Disp: , Rfl:    nortriptyline (PAMELOR) 25 MG capsule, Take one or two qHS, Disp: 180 capsule, Rfl: 3   rizatriptan (MAXALT) 10 MG tablet, Take 1 tablet (10 mg total) by mouth as needed for migraine. May repeat in 2 hours if needed, Disp: 30 tablet, Rfl: 4   rosuvastatin (CRESTOR) 10 MG tablet, Take 10 mg by mouth daily., Disp: , Rfl:    tamsulosin (FLOMAX) 0.4 MG CAPS capsule, Take 0.4 mg by mouth at bedtime., Disp: , Rfl:    Erenumab-aooe (AIMOVIG) 140 MG/ML SOAJ, inject '140mg'$  into the skin every 30 days (Patient not taking: Reported on 07/06/2022), Disp: 1 mL, Rfl: 11  PAST MEDICAL HISTORY: Past Medical History:  Diagnosis Date   Acid reflux    Asthma    Cancer ([place])    High cholesterol    History of kidney stones    Hx of skin cancer, basal cell    Hypertension    Kidney stones    Migraines    Vision abnormalities     PAST SURGICAL HISTORY: Past Surgical History:  Procedure Laterality Date   CHOLECYSTECTOMY     CYSTOSCOPY     ELBOW FRACTURE SURGERY Bilateral    EXTRACORPOREAL SHOCK WAVE LITHOTRIPSY Right 04/10/2019   Procedure: EXTRACORPOREAL SHOCK WAVE LITHOTRIPSY (ESWL);   Surgeon: Ardis Hughs, MD;  Location: [facility];  Service: Urology;  Laterality: Right;   LITHOTRIPSY     x4    FAMILY HISTORY: Family History  Problem Relation Age of Onset   Diabetes Mother    Hypertension Mother    Pneumonia Father    Hypertension Father    Diabetes Father    Dementia Father     SOCIAL HISTORY:  Social History   Socioeconomic History   Marital status: Married    Spouse name: Not on file   Number of children: Not on file   Years of education: Not on file   Highest education  level: Not on file  Occupational History   Not on file  Tobacco Use   Smoking status: Never   Smokeless tobacco: Never  Substance and Sexual Activity   Alcohol use: Not on file   Drug use: Not on file   Sexual activity: Not on file  Other Topics Concern   Not on file  Social History Narrative   Not on file   Social Determinants of Health   Financial Resource Strain: Not on file  Food Insecurity: Not on file  Transportation Needs: Not on file  Physical Activity: Not on file  Stress: Not on file  Social Connections: Not on file  Intimate Partner Violence: Not on file     PHYSICAL EXAM  Vitals:   07/06/22 1307  BP: 132/79  Pulse: 89  Weight: 206 lb (93.4 kg)  Height: 6' (1.829 m)    Body mass index is 27.94 kg/m.   General: The patient is well-developed and well-nourished and in no acute distress he has a rash in the right leg.  No rash on the trunk.     Neck/Back: The neck has good ROM.  The neck is nontender.  The occiput is nontender.  He has mild tenderness over the left rhomboid and the mid thoracic paraspinal muscles.  Neurologic Exam  Mental status: The patient is alert and oriented x 3 at the time of the examination. The patient has apparent normal recent and remote memory, with an apparently normal attention span and concentration ability.   Speech is normal.  Cranial nerves: Extraocular movements are full.  Facial strength is normal.  Trapezius  strength is normal.    No obvious hearing deficits are noted.  Motor:  Muscle bulk is normal.   Tone is normal.  Strength was normal.  Sensory: Slight allodynia from around T2-T8 on the left near the spine  Gait and station: Station is normal.   Gait is normal. Tandem gait is normal.   Romberg is negative.      ASSESSMENT AND PLAN  Common migraine without intractability  History of myocardial infarction  Upper back pain   1.    Continue prn gabapentin for the dysesthetic pain and insomnia   2.    Continue nortriptyline for chronic migraine and Maxalt as needed for breakthrough migraine.   AImovig not studied in CAD patients but not c.i so could consider again if HA worsens.   3.     He will return to see me in 1 year or sooner for new or worsening neurologic symptoms.  Ilisha Blust A. Felecia Shelling, MD, PhD 01/31/9701, 6:37 PM Certified in Neurology, Clinical Neurophysiology, Sleep Medicine, Pain Medicine and Neuroimaging  [facility] [address] [place], [place] [address] [phone]

## 2022-07-27 ENCOUNTER — Telehealth: Payer: Self-pay | Admitting: Neurology

## 2022-07-27 NOTE — Telephone Encounter (Signed)
Patient returned my call.  HST- [employer] medicare no Nathaniel Madden req spoke to [place] ref # [id number]   He is scheduled at [facility] for 08/22/22 at 11:30 AM.  Mailed packet to the patient.

## 2022-07-27 NOTE — Telephone Encounter (Signed)
[employer]na medicare no auth req spoke to [place] ref # [id number]   left VM to schedule 07/26/22 [place]  LVM for pt to call back to schedule

## 2022-08-22 ENCOUNTER — Ambulatory Visit (INDEPENDENT_AMBULATORY_CARE_PROVIDER_SITE_OTHER): Payer: Medicare HMO | Admitting: Neurology

## 2022-08-22 DIAGNOSIS — G4733 Obstructive sleep apnea (adult) (pediatric): Secondary | ICD-10-CM | POA: Diagnosis not present

## 2022-08-22 DIAGNOSIS — G4719 Other hypersomnia: Secondary | ICD-10-CM

## 2022-08-22 DIAGNOSIS — R0683 Snoring: Secondary | ICD-10-CM

## 2022-08-24 NOTE — Progress Notes (Signed)
   [facility]  HOME SLEEP STUDY  STUDY DATE: 08/22/2022 PATIENT NAME: Nathaniel Ratchford Md Leason DOB: 02-21-1951 MRN: [id number]  ORDERING CLINICIAN: Richard A. Felecia Shelling, MD, PhD REFERRING CLINICIAN: Richard A. Felecia Shelling, MD. PhD   CLINICAL INFORMATION: 71 year old man with snoring and excessive daytime sleepiness.  The Epworth sleepiness scale score was 12/24 (mild excessive daytime sleepiness)   IMPRESSION:  Mild OSA (pAHI 3% = 9.1/h) No nocturnal hypoxemia   RECOMMENDATION: For the mild OSA and excessive daytime sleepiness, CPAP could be considered.  Alternatively, weight loss and/or an oral appliance could also be considered. Follow-up with Dr. Felecia Shelling.   INTERPRETING PHYSICIAN:   Richard A. Felecia Shelling, MD, PhD, [facility] Certified in Neurology, Clinical Neurophysiology, Sleep Medicine, Pain Medicine and Neuroimaging  [facility] [address], [place], [place] [address] [phone]

## 2022-09-04 ENCOUNTER — Telehealth: Payer: Self-pay | Admitting: Neurology

## 2022-09-04 DIAGNOSIS — G4733 Obstructive sleep apnea (adult) (pediatric): Secondary | ICD-10-CM | POA: Insufficient documentation

## 2022-09-04 NOTE — Telephone Encounter (Signed)
The home sleep study showed mild OSA.  AHI was 9.1.  There is no nocturnal hypoxemia.  I discussed the results with Dr. Trudie Reed.  Because he has mild OSA and mild EDS, CPAP could be an option and we could order AutoPap 5-15.  Alternatively, he is a little overweight and a 20 pound weight loss would probably be sufficient to get him into the minimal OSA range.  He would prefer to try weight loss first but would let us know if he is unable to lose more than just a couple pounds.

## 2023-06-08 DIAGNOSIS — J45901 Unspecified asthma with (acute) exacerbation: Secondary | ICD-10-CM | POA: Insufficient documentation

## 2023-07-03 ENCOUNTER — Ambulatory Visit: Payer: Medicare HMO | Admitting: Neurology

## 2023-07-03 ENCOUNTER — Encounter: Payer: Self-pay | Admitting: Neurology

## 2023-07-03 VITALS — BP 120/82 | HR 77 | Ht 72.0 in | Wt 209.0 lb

## 2023-07-03 DIAGNOSIS — G4719 Other hypersomnia: Secondary | ICD-10-CM

## 2023-07-03 DIAGNOSIS — I252 Old myocardial infarction: Secondary | ICD-10-CM

## 2023-07-03 DIAGNOSIS — R208 Other disturbances of skin sensation: Secondary | ICD-10-CM | POA: Diagnosis not present

## 2023-07-03 DIAGNOSIS — G43709 Chronic migraine without aura, not intractable, without status migrainosus: Secondary | ICD-10-CM | POA: Diagnosis not present

## 2023-07-03 MED ORDER — AIMOVIG 70 MG/ML ~~LOC~~ SOAJ: SUBCUTANEOUS | 4 refills | Status: DC

## 2023-07-03 MED ORDER — GABAPENTIN 300 MG PO CAPS: ORAL_CAPSULE | ORAL | 5 refills | Status: DC

## 2023-07-03 MED ORDER — NORTRIPTYLINE HCL 25 MG PO CAPS: ORAL_CAPSULE | ORAL | 3 refills | Status: DC

## 2023-07-03 MED ORDER — RIZATRIPTAN BENZOATE 10 MG PO TABS: 10.0000 mg | ORAL_TABLET | ORAL | 4 refills | Status: DC | PRN

## 2023-07-03 NOTE — Progress Notes (Signed)
[facility]  PATIENT: Nathaniel Peon Md Karabin DOB: 1951-02-10  REFERRING DOCTOR OR PCP:  None SOURCE: Patient, notes from [facility]  _________________________________   HISTORICAL  CHIEF COMPLAINT:  Chief Complaint  Patient presents with   Room 10    Pt is here Alone. Pt states that he is getting more migraines. Pt states that he has numbness in his right hip with no pain.     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   Aimovig was d/c by cardiology.  PI and literature reviewed --- no C.I. but not studied in CAD population      He felt migraines were quite a bit better on it.    His migraines occur 1-2 a week and a triptan is helping.      He snores and has had some EDS, more than before.   HST showed mild OSA AHI=9.  .  On gabapentin, he is not having restless leg syndrome.      Memory issues are no longer an issue  EPWORTH SLEEPINESS SCALE  On a scale of 0 - 3 what is the chance of dozing:  Sitting and Reading:   2 Watching TV:     2 Sitting inactive in a public place: 1 Passenger in car for one hour: 1 Lying down to rest in the afternoon: 3 Sitting and talking to someone: 1 Sitting quietly after lunch:  2 In a car, stopped in traffic:  0  Total (out of 24):   12/24 (mild EDS)  Neck/upper back pain He takes Aleve a times.    Only takes gabapentin some nights now.      Chronic migraine medications tried and failed: Antiepileptics: Zonisamide/Topamax was associated with kidney stones. Keppra has not been of benefit and causes him to feel foggy. Tricyclics: He has benign prostatic hypertrophy. Beta blockers were noneffective in the past Muscle relaxants were noneffective in the past NSAIDs (multiple) have not been effective enough. Additionally, he has GERD  Dr. Lendon Colonel began to  experience headaches as a teenager but they were more intermittent when he was younger. The headaches became more chronic about 15 years ago. At time, they have mostly occurred on a near daily basis. In the past, he has been on multiple medications as migraine prophylaxis. Zonisamide was tried but he developed kidney stones and had to be discontinued. Due to the kidney stone risk, Topamax cannot be used. Keppra was initiated and helped his headaches initially ever, due to low platelets the Keppra needed to be cut back. At the current dose of 500 mg daily, it is not helping the headaches and he also feels a little bit of a mental fog. About 4 years ago, he was on Botox and got up to 6 months benefit to a frequency  less than one migraine a week for a while.      REVIEW OF SYSTEMS: Constitutional: No fevers, chills, sweats, or change in appetite.   He sleeps well Eyes: No visual changes, double vision, eye pain Ear, nose and throat: No hearing loss, ear pain, nasal congestion, sore throat Cardiovascular: No chest pain, palpitations Respiratory:  No shortness of breath at rest or with exertion.   No wheezes GastrointestinaI: No nausea, vomiting, diarrhea, abdominal pain, fecal incontinence Genitourinary:  He has BPH. He has a history kidney stones.. Musculoskeletal:  No neck pain, back pain Integumentary: No rash, pruritus, skin lesions Neurological: as above Psychiatric: No depression at this time.  No anxiety Endocrine: No palpitations, diaphoresis, change in appetite, change in weigh or increased thirst Hematologic/Lymphatic:  No anemia, purpura, petechiae. Allergic/Immunologic: No itchy/runny eyes, nasal congestion, recent allergic reactions, rashes  ALLERGIES: Allergies  Allergen Reactions   Levofloxacin     angioedema   Rocephin [Ceftriaxone Sodium In Dextrose]     angioedema    HOME MEDICATIONS:  Current Outpatient Medications:    albuterol (PROVENTIL HFA;VENTOLIN HFA) 108 (90  Base) MCG/ACT inhaler, Inhale 2 puffs into the lungs as needed., Disp: , Rfl:    DUPIXENT 300 MG/2ML prefilled syringe, Inject 300 mg into the skin every 14 (fourteen) days., Disp: , Rfl:    Erenumab-aooe (AIMOVIG) 70 MG/ML SOAJ, 1 mL pen subcutaneously every 28 days, Disp: 3 mL, Rfl: 4   esomeprazole (NEXIUM) 20 MG capsule, Take 40 mg by mouth daily at 12 noon., Disp: , Rfl:    hydrochlorothiazide (MICROZIDE) 12.5 MG capsule, Take 12.5 mg by mouth daily., Disp: , Rfl:    levothyroxine (SYNTHROID) 88 MCG tablet, Take 1 tablet by mouth daily., Disp: , Rfl:    loratadine-pseudoephedrine (CLARITIN-D 24-HOUR) 10-240 MG 24 hr tablet, Take 1 tablet by mouth daily., Disp: , Rfl:    Multiple Vitamins-Minerals (PRESERVISION AREDS PO), Take 1 tablet by mouth 2 (two) times daily., Disp: , Rfl:    naproxen sodium (ALEVE) 220 MG tablet, Take 220 mg by mouth as needed., Disp: , Rfl:    rosuvastatin (CRESTOR) 10 MG tablet, Take 10 mg by mouth daily., Disp: , Rfl:    tamsulosin (FLOMAX) 0.4 MG CAPS capsule, Take 0.4 mg by mouth at bedtime., Disp: , Rfl:    gabapentin (NEURONTIN) 300 MG capsule, One po qHS, Disp: 90 capsule, Rfl: 5   nortriptyline (PAMELOR) 25 MG capsule, Take one or two qHS, Disp: 180 capsule, Rfl: 3   rizatriptan (MAXALT) 10 MG tablet, Take 1 tablet (10 mg total) by mouth as needed for migraine. May repeat in 2 hours if needed, Disp: 30 tablet, Rfl: 4  PAST MEDICAL HISTORY: Past Medical History:  Diagnosis Date   Acid reflux    Asthma    Cancer (H[facility])    High cholesterol    History of kidney stones    Hx of skin cancer, basal cell    Hypertension    Kidney stones    Migraines    Vision abnormalities     PAST SURGICAL HISTORY: Past Surgical History:  Procedure Laterality Date   CHOLECYSTECTOMY     CYSTOSCOPY     ELBOW FRACTURE SURGERY Bilateral    EXTRACORPOREAL SHOCK WAVE LITHOTRIPSY Right 04/10/2019   Procedure: EXTRACORPOREAL SHOCK WAVE LITHOTRIPSY (ESWL);  Surgeon: Crist Fat, MD;  Location: [facility];  Service: Urology;  Laterality: Right;   LITHOTRIPSY     x4    FAMILY HISTORY: Family History  Problem Relation Age of Onset   Diabetes Mother    Hypertension Mother    Pneumonia Father    Hypertension Father    Diabetes Father    Dementia Father     SOCIAL HISTORY:  Social History   Socioeconomic History   Marital status: Married    Spouse name: Not on file   Number of children: Not on file   Years of education: Not on file   Highest education level: Not on  file  Occupational History   Not on file  Tobacco Use   Smoking status: Never   Smokeless tobacco: Never  Substance and Sexual Activity   Alcohol use: Not on file   Drug use: Not on file   Sexual activity: Not on file  Other Topics Concern   Not on file  Social History Narrative   Not on file   Social Determinants of Health   Financial Resource Strain: Low Risk  (03/29/2023)   Received from [facility]   Overall Financial Resource Strain (CARDIA)    Difficulty of Paying Living Expenses: Not hard at all  Food Insecurity: Low Risk  (06/21/2023)   Received from [facility]   Food vital sign    Within the past 12 months, you worried that your food would run out before you got money to buy more: Never true    Within the past 12 months, the food you bought just didn't last and you didn't have money to get more. : Never true  Transportation Needs: Not on file (06/21/2023)  Physical Activity: Inactive (03/29/2023)   Received from [facility]   Exercise Vital Sign    Days of Exercise per Week: 0 days    Minutes of Exercise per Session: 30 min  Stress: Stress Concern Present (04/23/2023)   Received from [facility] of Occupational Health - Occupational Stress Questionnaire    Feeling of Stress : To some extent  Social Connections: Socially Integrated (03/29/2023)   Received from [facility]   Social Network    How would you rate your social network (family,  work, friends)?: Good participation with social networks  Intimate Partner Violence: Not At Risk (04/23/2023)   Received from [facility]   HITS    Over the last 12 months how often did your partner physically hurt you?: 1    Over the last 12 months how often did your partner insult you or talk down to you?: 1    Over the last 12 months how often did your partner threaten you with physical harm?: 1    Over the last 12 months how often did your partner scream or curse at you?: 1     PHYSICAL EXAM  Vitals:   07/03/23 1308  BP: 120/82  Pulse: 77  Weight: 209 lb (94.8 kg)  Height: 6' (1.829 m)    Body mass index is 28.35 kg/m.   General: The patient is well-developed and well-nourished and in no acute distress he has a rash in the right leg.  No rash on the trunk.     Neck/Back: The neck has good ROM.     Neurologic Exam  Mental status: The patient is alert and oriented x 3 at the time of the examination. The patient has apparent normal recent and remote memory, with an apparently normal attention span and concentration ability.   Speech is normal.  Cranial nerves: Extraocular movements are full.  Facial strength is normal.  Trapezius strength is normal.    No obvious hearing deficits are noted.  Motor:  Muscle bulk is normal.   Tone is normal.  Strength was normal.  Gait and station: Station is normal.   Gait is normal. Tandem gait is normal.   Romberg is negative.      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    Continue  nighttime gabapentin for the RLS/dysesthesia and insomnia   2.    Continue nortriptyline for chronic migraine and Maxalt as needed for breakthrough migraine.   AImovig not studied in CAD patients but not c.i so we will restart as he did better while on it. .   3.     He will return to see me in 1 year or sooner for new or  worsening neurologic symptoms.  Bowe Sidor A. Epimenio Foot, MD, PhD 07/03/2023, 1:41 PM Certified in Neurology, Clinical Neurophysiology, Sleep Medicine, Pain Medicine and Neuroimaging  [facility] [address] [place], [place] [address] [phone]

## 2023-08-06 ENCOUNTER — Telehealth: Payer: Self-pay | Admitting: *Deleted

## 2023-08-06 NOTE — Telephone Encounter (Signed)
Recevied fax from [facility] approval for AIMOVIG 70 mg/ml  Quantity limits. 11-20-2022 thru 11-20-2023, 1 every 28 days. CS [phone].  ID [id number].  Approval faxed to [facility].

## 2024-03-09 DIAGNOSIS — R0981 Nasal congestion: Secondary | ICD-10-CM | POA: Insufficient documentation

## 2024-03-09 DIAGNOSIS — R051 Acute cough: Secondary | ICD-10-CM | POA: Insufficient documentation

## 2024-03-09 DIAGNOSIS — R52 Pain, unspecified: Secondary | ICD-10-CM | POA: Insufficient documentation

## 2024-03-09 DIAGNOSIS — R0602 Shortness of breath: Secondary | ICD-10-CM | POA: Insufficient documentation

## 2024-03-09 DIAGNOSIS — R509 Fever, unspecified: Secondary | ICD-10-CM | POA: Insufficient documentation

## 2024-03-09 DIAGNOSIS — Z1152 Encounter for screening for COVID-19: Secondary | ICD-10-CM | POA: Insufficient documentation

## 2024-03-13 DIAGNOSIS — L209 Atopic dermatitis, unspecified: Secondary | ICD-10-CM | POA: Insufficient documentation

## 2024-06-14 ENCOUNTER — Other Ambulatory Visit: Payer: Self-pay | Admitting: Neurology

## 2024-06-16 NOTE — Telephone Encounter (Signed)
 Last seen on 07/03/23 Follow up scheduled on 06/20/24   Dispensed Days Supply Quantity Provider Pharmacy  GABAPENTIN  300MG (N) CAP 03/11/2024 90 90 each Sater, Charlie LABOR, MD [email or web address]

## 2024-06-18 ENCOUNTER — Other Ambulatory Visit: Payer: Self-pay | Admitting: Urology

## 2024-06-18 DIAGNOSIS — R972 Elevated prostate specific antigen [PSA]: Secondary | ICD-10-CM

## 2024-06-19 ENCOUNTER — Encounter: Payer: Self-pay | Admitting: Urology

## 2024-06-26 ENCOUNTER — Encounter: Payer: Self-pay | Admitting: Urology

## 2024-07-02 ENCOUNTER — Other Ambulatory Visit: Payer: Self-pay | Admitting: Neurology

## 2024-07-02 NOTE — Telephone Encounter (Signed)
 Last seen on 07/03/23 Follow up scheduled on 07/08/24   Dispensed Days Supply Quantity Provider Pharmacy  AIMOVIG       INJ 70MG /ML 06/28/2024 28 1 mL Sater, Charlie LABOR, MD [email or web address]

## 2024-07-08 ENCOUNTER — Ambulatory Visit: Payer: Medicare HMO | Admitting: Neurology

## 2024-07-08 ENCOUNTER — Encounter: Payer: Self-pay | Admitting: Neurology

## 2024-07-08 VITALS — BP 142/84 | HR 66 | Ht 72.0 in | Wt 208.0 lb

## 2024-07-08 DIAGNOSIS — G43709 Chronic migraine without aura, not intractable, without status migrainosus: Secondary | ICD-10-CM

## 2024-07-08 DIAGNOSIS — G4719 Other hypersomnia: Secondary | ICD-10-CM

## 2024-07-08 DIAGNOSIS — I252 Old myocardial infarction: Secondary | ICD-10-CM | POA: Diagnosis not present

## 2024-07-08 MED ORDER — RIZATRIPTAN BENZOATE 10 MG PO TABS: 10.0000 mg | ORAL_TABLET | ORAL | 4 refills | Status: DC | PRN

## 2024-07-08 MED ORDER — AIMOVIG 70 MG/ML ~~LOC~~ SOAJ: SUBCUTANEOUS | 4 refills | Status: AC

## 2024-07-08 MED ORDER — GABAPENTIN 300 MG PO CAPS: 300.0000 mg | ORAL_CAPSULE | Freq: Every day | ORAL | 0 refills | Status: DC

## 2024-07-08 MED ORDER — AIMOVIG 70 MG/ML ~~LOC~~ SOAJ: SUBCUTANEOUS | 0 refills | Status: DC

## 2024-07-08 NOTE — Progress Notes (Signed)
 [facility]  PATIENT: Nathaniel SAILOR Md Creed DOB: December 22, 1950  REFERRING DOCTOR OR PCP:  None SOURCE: Patient, notes from [facility]  _________________________________   HISTORICAL  CHIEF COMPLAINT:  Chief Complaint  Patient presents with   RM10/Migraines    Pt is here  Alone. Pt states that things have been going good with his migraines. Pt states that he would like to know if he needs to continue the Nortriptyline .     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     Aimovig  was d/c by cardiology.  PI and literature reviewed --- no C.I. but not studied in CAD population      He felt migraines were quite a bit better on it.      We had restarted the Aimovig  after last visit.   His migraines occur 2-3/month on Aimovig  and a triptan is helping.    He had some chest pain and repeat cath was fine.      He snores and has had some EDS, more than before.   HST showed mild OSA AHI=9. He is going to try to lose weight and will be starting Zepbound.   On gabapentin ,  restless leg syndrome is much better.    .      Memory issues are no longer an issue.    He was in a diet and exercise study for cognition.     He is living most of the time in [place]. [place].    EPWORTH SLEEPINESS SCALE  On a scale of 0 - 3 what is the chance of dozing:  Sitting and Reading:   2 Watching TV:     2 Sitting inactive in a public place: 1 Passenger in car for one hour: 1 Lying down to rest in the afternoon: 3 Sitting and talking to someone: 1 Sitting quietly after lunch:  2 In a car, stopped in traffic:  0  Total (out of 24):   12/24 (mild EDS)  Neck/upper back pain He takes Aleve a times.    Only takes gabapentin  some nights now.      Chronic migraine medications tried and failed: Antiepileptics: Zonisamide/Topamax was  associated with kidney stones. Keppra has not been of benefit and causes him to feel foggy. Tricyclics: He has benign prostatic hypertrophy. Beta blockers were noneffective in the past Muscle relaxants were noneffective in the past NSAIDs (multiple) have not been effective enough. 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 The headaches became more chronic about 15 years ago. At time, they have mostly occurred on a near daily basis. In the past, he has been on multiple medications as migraine prophylaxis. Zonisamide was tried but he developed kidney stones and had to be discontinued. Due to the kidney stone risk, Topamax cannot be used. Keppra was initiated and helped his headaches initially ever, due to low platelets the Keppra needed to be cut back. At the current dose of 500 mg daily, it is not helping the headaches and he also feels a little bit of a mental fog. About 4 years ago, he was on Botox and got up to 6 months benefit to a frequency  less than one migraine a week  for a while.      REVIEW OF SYSTEMS: Constitutional: No fevers, chills, sweats, or change in appetite.   He sleeps well Eyes: No visual changes, double vision, eye pain Ear, nose and throat: No hearing loss, ear pain, nasal congestion, sore throat Cardiovascular: No chest pain, palpitations Respiratory:  No shortness of breath at rest or with exertion.   No wheezes GastrointestinaI: No nausea, vomiting, diarrhea, abdominal pain, fecal incontinence Genitourinary:  He has BPH. He has a history kidney stones.. Musculoskeletal:  No neck pain, back pain Integumentary: No rash, pruritus, skin lesions Neurological: as above Psychiatric: No depression at this time.  No anxiety Endocrine: No palpitations, diaphoresis, change in appetite, change in weigh or increased thirst Hematologic/Lymphatic:  No anemia, purpura,  petechiae. Allergic/Immunologic: No itchy/runny eyes, nasal congestion, recent allergic reactions, rashes  ALLERGIES: Allergies  Allergen Reactions   Levofloxacin     angioedema   Rocephin [Ceftriaxone Sodium In Dextrose ]     angioedema    HOME MEDICATIONS:  Current Outpatient Medications:    albuterol (PROVENTIL HFA;VENTOLIN HFA) 108 (90 Base) MCG/ACT inhaler, Inhale 2 puffs into the lungs as needed., Disp: , Rfl:    DUPIXENT 300 MG/2ML prefilled syringe, Inject 300 mg into the skin every 14 (fourteen) days., Disp: , Rfl:    esomeprazole (NEXIUM) 20 MG capsule, Take 40 mg by mouth daily at 12 noon., Disp: , Rfl:    hydrochlorothiazide (MICROZIDE) 12.5 MG capsule, Take 12.5 mg by mouth daily., Disp: , Rfl:    levothyroxine (SYNTHROID) 88 MCG tablet, Take 1 tablet by mouth daily., Disp: , Rfl:    loratadine-pseudoephedrine (CLARITIN-D 24-HOUR) 10-240 MG 24 hr tablet, Take 1 tablet by mouth daily., Disp: , Rfl:    Multiple Vitamins-Minerals (PRESERVISION AREDS PO), Take 1 tablet by mouth 2 (two) times daily., Disp: , Rfl:    naproxen sodium (ALEVE) 220 MG tablet, Take 220 mg by mouth as needed., Disp: , Rfl:    REPATHA SURECLICK 140 MG/ML SOAJ, , Disp: , Rfl:    tamsulosin (FLOMAX) 0.4 MG CAPS capsule, Take 0.4 mg by mouth at bedtime., Disp: , Rfl:    Erenumab -aooe (AIMOVIG ) 70 MG/ML SOAJ, INJECT 1 PEN UNDER THE SKIN ONCE A MONTH, Disp: 3 mL, Rfl: 4   gabapentin  (NEURONTIN ) 300 MG capsule, Take 1 capsule (300 mg total) by mouth at bedtime., Disp: 90 capsule, Rfl: 0   rizatriptan  (MAXALT ) 10 MG tablet, Take 1 tablet (10 mg total) by mouth as needed for migraine. May repeat in 2 hours if needed, Disp: 30 tablet, Rfl: 4   rosuvastatin (CRESTOR) 10 MG tablet, Take 10 mg by mouth daily., Disp: , Rfl:    tirzepatide (ZEPBOUND) 2.5 MG/0.5ML Pen, Inject 2.5 mg into the skin. (Patient not taking: Reported on 07/08/2024), Disp: , Rfl:   PAST MEDICAL HISTORY: Past Medical History:  Diagnosis Date    Acid reflux    Asthma    Cancer (HCC)    High cholesterol    History of kidney stones    Hx of skin cancer, basal cell    Hypertension    Kidney stones    Migraines    Vision abnormalities     PAST SURGICAL HISTORY: Past Surgical History:  Procedure Laterality Date   CHOLECYSTECTOMY     CYSTOSCOPY     ELBOW FRACTURE SURGERY Bilateral    EXTRACORPOREAL SHOCK WAVE LITHOTRIPSY Right 04/10/2019   Procedure: EXTRACORPOREAL SHOCK WAVE LITHOTRIPSY (ESWL);  Surgeon: Cam Morene ORN, MD;  Location: [facility];  Service: Urology;  Laterality: Right;   LITHOTRIPSY     x4    FAMILY HISTORY: Family History  Problem Relation Age of Onset   Diabetes Mother    Hypertension Mother    Pneumonia Father    Hypertension Father    Diabetes Father    Dementia Father     SOCIAL HISTORY:  Social History   Socioeconomic History   Marital status: Married    Spouse name: Not on file   Number of children: Not on file   Years of education: Not on file   Highest education level: Not on file  Occupational History   Not on file  Tobacco Use   Smoking status: Never   Smokeless tobacco: Never  Substance and Sexual Activity   Alcohol use: Not on file   Drug use: Not on file   Sexual activity: Not on file  Other Topics Concern   Not on file  Social History Narrative   Not on file   Social Drivers of Health   Financial Resource Strain: Low Risk  (03/29/2023)   Received from [facility]   Overall Financial Resource Strain (CARDIA)    Difficulty of Paying Living Expenses: Not hard at all  Food Insecurity: Low Risk  (05/27/2024)   Received from [facility]   Hunger Vital Sign    Within the past 12 months, you worried that your food would run out before you got money to buy more: Never true    Within the past 12 months, the food you bought just didn't last and you didn't have money to get more. : Never true  Transportation Needs: No Transportation Needs (05/27/2024)   Received from  [facility]    In the past 12 months, has lack of reliable transportation kept you from medical appointments, meetings, work or from getting things needed for daily living? : No  Physical Activity: Unknown (03/29/2023)   Received [facility]   Exercise Vital Sign    On average, how many days per week do you engage in moderate to strenuous exercise (like a brisk walk)?: 0 days    Minutes of Exercise per Session: Not on file  Recent Concern: Physical Activity - Inactive (03/29/2023)   Received from [facility]   Exercise Vital Sign    Days of Exercise per Week: 0 days    Minutes of Exercise per Session: 30 min  Stress: Stress Concern Present (04/23/2023)   Received from [facility] - Occupational Stress Questionnaire    Feeling of Stress : To some extent  Social Connections: Socially Integrated (03/29/2023)   Received from [facility]   Social Network    How would you rate your social network (family, work, friends)?: Good participation with social networks  Intimate Partner Violence: Not At Risk (04/23/2023)   Received from [facility]   HITS    Over the last 12 months how often did your partner physically hurt you?: Never    Over the last 12 months how often did your partner insult you or talk down to you?: Never    Over the last 12 months how often did your partner threaten you with physical harm?: Never    Over the last 12 months how often did your partner scream or curse at you?: Never     PHYSICAL EXAM  Vitals:   07/08/24 1258  BP: (!) 142/84  Pulse: 66  SpO2: 98%  Weight:  208 lb (94.3 kg)  Height: 6' (1.829 m)     Body mass index is 28.21 kg/m.   General: The patient is well-developed and well-nourished and in no acute distress he has a rash in the right leg.  No rash on the trunk.     Neck/Back: The neck has good ROM.     Neurologic Exam  Mental status: The patient is alert and oriented x 3  at the time of the examination. The patient has apparent normal recent and remote memory, with an apparently normal attention span and concentration ability.   Speech is normal.  Cranial nerves: Extraocular movements are full.  Facial strength is normal.  Trapezius strength is normal.    No obvious hearing deficits are noted.  Motor:  Muscle bulk is normal.   Tone is normal.  Strength was normal.  Gait and station: Station is normal.   Gait is normal. Tandem gait is normal.   Romberg is negative.      ASSESSMENT AND PLAN  Chronic migraine w/o aura, not intractable, w/o stat migr  History of myocardial infarction  Excessive daytime sleepiness   1.    Continue Aimovig  for chronic migraine - for rare breeakthrough migraine can take Maxalt .   Will d/c nortriptyline  2.    Continue nighttime gabapentin  for the RLS/dysesthesia and insomnia   3.     He will return to see me in 1 year or sooner for new or worsening neurologic symptoms.  This visit is part of a comprehensive longitudinal care medical relationship regarding the patients primary diagnosis of chronic migraine and related concerns.   Tersea Aulds A. Vear, MD, PhD 07/08/2024, 1:31 PM Certified in Neurology, Clinical Neurophysiology, Sleep Medicine, Pain Medicine and Neuroimaging  [facility] [address], [address] [place], [place] [address] [phone]

## 2024-07-10 ENCOUNTER — Other Ambulatory Visit (HOSPITAL_COMMUNITY): Payer: Self-pay

## 2024-07-10 ENCOUNTER — Telehealth: Payer: Self-pay | Admitting: Pharmacist

## 2024-07-10 NOTE — Telephone Encounter (Signed)
 PA is not needed for Rizatriptan , per test claim, insurance will cover 12 tablets per 30 days.    Pharmacy notified.

## 2024-07-10 NOTE — Telephone Encounter (Signed)
 Pharmacy Patient Advocate Encounter   Received notification from Patient Pharmacy that prior authorization for Rizatriptan  Benzoate 10MG  tablets is required/requested.   Insurance verification completed.   The patient is insured through [facility] .   Per test claim: PA required; PA started via CoverMyMeds. KEY  [id number] . Waiting for clinical questions to populate.

## 2024-07-25 ENCOUNTER — Ambulatory Visit
Admission: RE | Admit: 2024-07-25 | Discharge: 2024-07-25 | Disposition: A | Discharge status: Home / Self Care | Payer: Self-pay | Source: Ambulatory Visit | Attending: Urology | Admitting: Urology

## 2024-07-25 DIAGNOSIS — R972 Elevated prostate specific antigen [PSA]: Secondary | ICD-10-CM

## 2024-07-25 MED ORDER — GADOPICLENOL 0.5 MMOL/ML IV SOLN
10.0000 mL | Freq: Once | INTRAVENOUS | Status: AC | PRN
  Administered 2024-07-25: 10 mL via INTRAVENOUS

## 2024-11-26 ENCOUNTER — Other Ambulatory Visit: Payer: Self-pay

## 2024-11-26 MED ORDER — RIZATRIPTAN BENZOATE 10 MG PO TABS: 10.0000 mg | ORAL_TABLET | ORAL | 4 refills | Status: AC | PRN

## 2024-12-21 ENCOUNTER — Other Ambulatory Visit: Payer: Self-pay | Admitting: Neurology

## 2025-07-14 ENCOUNTER — Ambulatory Visit: Admitting: Neurology
